# Patient Record
Sex: Female | Born: 1960 | Race: White | Hispanic: No | Marital: Married | Smoking: Current every day smoker
Health system: Southern US, Community
[De-identification: ages and names within clinical notes are randomized; demographics above are authoritative.]

## PROBLEM LIST (undated history)

## (undated) DIAGNOSIS — J45909 Unspecified asthma, uncomplicated: Secondary | ICD-10-CM

## (undated) DIAGNOSIS — M797 Fibromyalgia: Secondary | ICD-10-CM

## (undated) DIAGNOSIS — E119 Type 2 diabetes mellitus without complications: Secondary | ICD-10-CM

## (undated) DIAGNOSIS — K219 Gastro-esophageal reflux disease without esophagitis: Secondary | ICD-10-CM

## (undated) HISTORY — DX: Gastro-esophageal reflux disease without esophagitis: K21.9

## (undated) HISTORY — DX: Type 2 diabetes mellitus without complications: E11.9

## (undated) HISTORY — DX: Unspecified asthma, uncomplicated: J45.909

---

## 2005-01-08 ENCOUNTER — Emergency Department: Payer: Self-pay | Admitting: Unknown Physician Specialty

## 2005-01-09 ENCOUNTER — Emergency Department: Payer: Self-pay | Admitting: Unknown Physician Specialty

## 2005-11-12 ENCOUNTER — Emergency Department: Payer: Self-pay | Admitting: Emergency Medicine

## 2007-06-17 ENCOUNTER — Emergency Department: Payer: Self-pay | Admitting: Emergency Medicine

## 2011-09-16 ENCOUNTER — Emergency Department: Payer: Self-pay | Admitting: Emergency Medicine

## 2012-01-15 ENCOUNTER — Emergency Department: Payer: Self-pay | Admitting: Emergency Medicine

## 2012-04-12 ENCOUNTER — Emergency Department: Payer: Self-pay | Admitting: Emergency Medicine

## 2013-12-27 ENCOUNTER — Ambulatory Visit: Payer: Self-pay

## 2014-03-29 DIAGNOSIS — M1990 Unspecified osteoarthritis, unspecified site: Secondary | ICD-10-CM | POA: Insufficient documentation

## 2014-03-29 DIAGNOSIS — M2550 Pain in unspecified joint: Secondary | ICD-10-CM | POA: Insufficient documentation

## 2015-09-12 ENCOUNTER — Ambulatory Visit: Payer: Self-pay | Attending: Oncology | Admitting: *Deleted

## 2015-09-12 ENCOUNTER — Ambulatory Visit
Admission: RE | Admit: 2015-09-12 | Discharge: 2015-09-12 | Disposition: A | Discharge status: Home / Self Care | Payer: Self-pay | Source: Ambulatory Visit | Attending: Oncology | Admitting: Oncology

## 2015-09-12 ENCOUNTER — Encounter: Payer: Self-pay | Admitting: *Deleted

## 2015-09-12 VITALS — BP 136/84 | HR 80 | Temp 99.0°F | Resp 18 | Ht 64.17 in | Wt 187.6 lb

## 2015-09-12 DIAGNOSIS — Z0000 Encounter for general adult medical examination without abnormal findings: Secondary | ICD-10-CM

## 2015-09-12 NOTE — Progress Notes (Signed)
Subjective:     Patient ID: Stacey Everett, female   DOB: [birth year].   MRN: [id number]  HPI   Review of Systems     Objective:   Physical Exam  Pulmonary/Chest: Right breast exhibits no inverted nipple, no mass, no nipple discharge and no skin change. Left breast exhibits no inverted nipple, no mass, no nipple discharge, no skin change and no tenderness. Breasts are asymmetrical.  Left breast 1-2 cup sizes larger than the right.  Complains of right breast tenderness.  States she drinks 3-4 cups of coffee per day.       Assessment:     55 year old White female presents to [facility] for clinical breast exam and mammogram.  Clinical breast exam unremarkable.  Taught self breast awareness using the teach back method.  Patient has been screened for eligibility.  She does not have any insurance, Medicare or Medicaid.  She also meets financial eligibility.  Hand-out given on the Affordable Care Act.  Patient with financial issues and cannot afford groceries.  States she only gets $16 worth of food stamps.     Plan:     Groceries from the cancer [facility] food bank given to patient by Stacey Georgia, RN.  Screening mammogram ordered.  Will follow-up per [facility]CP protocol.

## 2015-09-21 ENCOUNTER — Encounter: Payer: Self-pay | Admitting: *Deleted

## 2015-09-21 NOTE — Progress Notes (Signed)
Letter mailed from the [facility] to inform patient of her normal mammogram results.  Patient is to follow-up with annual screening in one year.  HSIS to Christy. 

## 2015-10-04 ENCOUNTER — Ambulatory Visit: Payer: Self-pay

## 2015-10-04 DIAGNOSIS — Z720 Tobacco use: Secondary | ICD-10-CM | POA: Insufficient documentation

## 2015-10-04 DIAGNOSIS — E119 Type 2 diabetes mellitus without complications: Secondary | ICD-10-CM | POA: Insufficient documentation

## 2015-10-04 DIAGNOSIS — E039 Hypothyroidism, unspecified: Secondary | ICD-10-CM | POA: Insufficient documentation

## 2015-10-04 DIAGNOSIS — E7800 Pure hypercholesterolemia, unspecified: Secondary | ICD-10-CM | POA: Insufficient documentation

## 2015-10-16 DIAGNOSIS — E7800 Pure hypercholesterolemia, unspecified: Secondary | ICD-10-CM

## 2015-10-16 DIAGNOSIS — E039 Hypothyroidism, unspecified: Secondary | ICD-10-CM

## 2015-10-16 DIAGNOSIS — E119 Type 2 diabetes mellitus without complications: Secondary | ICD-10-CM

## 2015-10-16 DIAGNOSIS — Z720 Tobacco use: Secondary | ICD-10-CM

## 2015-10-22 ENCOUNTER — Ambulatory Visit: Payer: Self-pay

## 2015-10-22 ENCOUNTER — Encounter (INDEPENDENT_AMBULATORY_CARE_PROVIDER_SITE_OTHER): Payer: Self-pay

## 2015-10-25 ENCOUNTER — Ambulatory Visit: Payer: Self-pay | Admitting: Internal Medicine

## 2015-10-25 VITALS — BP 133/82 | HR 81 | Wt 192.0 lb

## 2015-10-25 DIAGNOSIS — E7800 Pure hypercholesterolemia, unspecified: Secondary | ICD-10-CM

## 2015-10-25 DIAGNOSIS — E119 Type 2 diabetes mellitus without complications: Secondary | ICD-10-CM

## 2015-10-25 DIAGNOSIS — E039 Hypothyroidism, unspecified: Secondary | ICD-10-CM

## 2015-10-25 DIAGNOSIS — M2550 Pain in unspecified joint: Secondary | ICD-10-CM

## 2015-10-25 DIAGNOSIS — M5430 Sciatica, unspecified side: Secondary | ICD-10-CM

## 2015-10-25 MED ORDER — GABAPENTIN 400 MG PO CAPS: 400.0000 mg | ORAL_CAPSULE | Freq: Three times a day (TID) | ORAL | Status: DC

## 2015-10-25 MED ORDER — CYCLOBENZAPRINE HCL 5 MG PO TABS: 10.0000 mg | ORAL_TABLET | Freq: Every evening | ORAL | Status: DC | PRN

## 2015-10-25 NOTE — Progress Notes (Signed)
Subjective:    Patient ID: Stacey Everett, female    DOB: 06-08-1961, 55 y.o.   MRN: [id number]  HPI  55YO female presents for follow up.  Not feeling well.  Hurts "all over."  Chronic severe pain in neck, hands. Taking Gabapentin 327m tid. No improvement with this. [facility] Wants to try a new medication called "quill." Also has chronic pain in back which radiates down back of legs. Has some numbness in lower legs bilaterally. Ongoing for 4-5 months. No improvement recently with gabapentin.  Seen by rheumatology in the past, but did not find this helpful.  DM - Does not check BG regularly.   Wt Readings from Last 3 Encounters:  10/25/15 192 lb (87.091 kg)  10/04/15 183 lb (83.008 kg)  09/12/15 187 lb 9.8 oz (85.1 kg)   BP Readings from Last 3 Encounters:  10/25/15 133/82  10/04/15 131/80  09/12/15 136/84    Past Medical History  Diagnosis Date  . GERD (gastroesophageal reflux disease)   . Diabetes mellitus without complication ([place]   . Asthma    Family History  Problem Relation Age of Onset  . Lung cancer Father   . Diabetes Father   . Atrial fibrillation Father   . Heart attack Father   . Stroke Father   . Colon cancer Paternal Grandmother   . Lupus Sister    History reviewed. No pertinent past surgical history. Social History   Social History  . Marital Status: Married    Spouse Name: N/A  . Number of Children: N/A  . Years of Education: N/A   Social History Main Topics  . Smoking status: Current Every Day Smoker -- 0.50 packs/day for 14 years    Types: Cigarettes  . Smokeless tobacco: Never Used  . Alcohol Use: No  . Drug Use: No  . Sexual Activity:    Partners: Male   Other Topics Concern  . None   Social History Narrative    Review of Systems  Constitutional: Negative for fever, chills, appetite change, fatigue and unexpected weight change.  Eyes: Negative for visual disturbance.  Respiratory: Negative for  shortness of breath.   Cardiovascular: Negative for chest pain and leg swelling.  Gastrointestinal: Negative for nausea, vomiting, abdominal pain, diarrhea and constipation.  Musculoskeletal: Positive for myalgias, back pain and arthralgias.  Skin: Negative for color change and rash.  Neurological: Positive for numbness. Negative for weakness.  Hematological: Negative for adenopathy. Does not bruise/bleed easily.  Psychiatric/Behavioral: Negative for sleep disturbance and dysphoric mood. The patient is not nervous/anxious.        Objective:    BP 133/82 mmHg  Pulse 81  Wt 192 lb (87.091 kg)  SpO2 96% Physical Exam  Constitutional: She is oriented to person, place, and time. She appears well-developed and well-nourished. No distress.  HENT:  Head: Normocephalic and atraumatic.  Right Ear: External ear normal.  Left Ear: External ear normal.  Nose: Nose normal.  Mouth/Throat: Oropharynx is clear and moist.  Eyes: Conjunctivae are normal. Pupils are equal, round, and reactive to light. Right eye exhibits no discharge. Left eye exhibits no discharge. No scleral icterus.  Neck: Normal range of motion. Neck supple. No tracheal deviation present. No thyromegaly present.  Cardiovascular: Normal rate, regular rhythm, normal heart sounds and intact distal pulses.  Exam reveals no gallop and no friction rub.   No murmur heard. Pulmonary/Chest: Effort normal and breath sounds normal. No respiratory distress. She has no wheezes. She  has no rales. She exhibits no tenderness.  Musculoskeletal: Normal range of motion. She exhibits no edema or tenderness.  Lymphadenopathy:    She has no cervical adenopathy.  Neurological: She is alert and oriented to person, place, and time. No cranial nerve deficit. She exhibits normal muscle tone. Coordination normal.  Skin: Skin is warm and dry. No rash noted. She is not diaphoretic. No erythema. No pallor.  Psychiatric: She has a normal mood and affect. Her  behavior is normal. Judgment and thought content normal.          Assessment & Plan:   Problem List Items Addressed This Visit      Unprioritized   Arthralgia    Diffuse chronic arthralgia. Reviewed notes from [facility]. Recommended she follow up with them, but she declines. Recommended pain management but she declines. Will repeat some screening labs for RA. However, more likely combination of pain from diabetic neuropathy and OA. Will increase neurontin to 467m tid. Continue Flexeril. Follow up 4 weeks and prn.      Relevant Medications   gabapentin (NEURONTIN) 400 MG capsule   cyclobenzaprine (FLEXERIL) 5 MG tablet   Other Relevant Orders   CBC with Differential/Platelet   Rheumatoid Factor   ANA   Sed Rate (ESR)   C-reactive protein   Diabetes ([place] - Primary    A1c with labs. Continue Metformin.      Relevant Orders   Comprehensive metabolic panel   Lipid panel   Hemoglobin A1c   High cholesterol    Lipids with labs. Continue Crestor.      Hypothyroidism    TSH with labs. Continue Levothyroxine.      Relevant Orders   TSH   Sciatic pain    Chronic sciatic pain. Will set up evaluation with ortho. Question if ESI might be helpful. Increase Gabapentin to 4040mpo tid. Follow up 4 weeks.      Relevant Medications   busPIRone (BUSPAR) 10 MG tablet   venlafaxine (EFFEXOR) 37.5 MG tablet   venlafaxine XR (EFFEXOR-XR) 75 MG 24 hr capsule   gabapentin (NEURONTIN) 400 MG capsule   cyclobenzaprine (FLEXERIL) 5 MG tablet   Other Relevant Orders   Ambulatory referral to Orthopedic Surgery       Return in about 4 weeks (around 11/22/2015) for Recheck of Diabetes.  JeRonette DeterMD Internal Medicine [place]

## 2015-10-25 NOTE — Assessment & Plan Note (Signed)
A1c with labs. Continue Metformin.

## 2015-10-25 NOTE — Assessment & Plan Note (Signed)
TSH with labs. Continue Levothyroxine. 

## 2015-10-25 NOTE — Assessment & Plan Note (Signed)
Diffuse chronic arthralgia. Reviewed notes from [facility]. Recommended she follow up with them, but she declines. Recommended pain management but she declines. Will repeat some screening labs for RA. However, more likely combination of pain from diabetic neuropathy and OA. Will increase neurontin to  tid. Continue Flexeril. Follow up 4 weeks and prn.

## 2015-10-25 NOTE — Patient Instructions (Addendum)
Increase Gabapentin to  three times daily.  Continue Cyclobenzaprine as needed for severe pain.  Labs today.  We will set up evaluation with orthopedics.  Consider follow up with rheumatology.  Follow up in 3 months.

## 2015-10-25 NOTE — Assessment & Plan Note (Signed)
Chronic sciatic pain. Will set up evaluation with ortho. Question if ESI might be helpful. Increase Gabapentin to  po tid. Follow up 4 weeks.

## 2015-10-25 NOTE — Assessment & Plan Note (Signed)
Lipids with labs. Continue Crestor.

## 2015-10-26 LAB — CBC WITH DIFFERENTIAL/PLATELET
Basophils Absolute: 0.1 x10E3/uL (ref 0.0–0.2)
Basos: 1 %
EOS (ABSOLUTE): 0.6 x10E3/uL — ABNORMAL HIGH (ref 0.0–0.4)
Eos: 5 %
Hematocrit: 44 % (ref 34.0–46.6)
Hemoglobin: 15.2 g/dL (ref 11.1–15.9)
Immature Grans (Abs): 0 x10E3/uL (ref 0.0–0.1)
Immature Granulocytes: 0 %
Lymphocytes Absolute: 3.3 x10E3/uL — ABNORMAL HIGH (ref 0.7–3.1)
Lymphs: 27 %
MCH: 32.5 pg (ref 26.6–33.0)
MCHC: 34.5 g/dL (ref 31.5–35.7)
MCV: 94 fL (ref 79–97)
Monocytes Absolute: 0.7 x10E3/uL (ref 0.1–0.9)
Monocytes: 6 %
Neutrophils Absolute: 7.4 x10E3/uL — ABNORMAL HIGH (ref 1.4–7.0)
Neutrophils: 61 %
Platelets: 316 x10E3/uL (ref 150–379)
RBC: 4.68 x10E6/uL (ref 3.77–5.28)
RDW: 14 % (ref 12.3–15.4)
WBC: 12.1 x10E3/uL — ABNORMAL HIGH (ref 3.4–10.8)

## 2015-10-26 LAB — COMPREHENSIVE METABOLIC PANEL
ALT: 16 IU/L (ref 0–32)
AST: 20 IU/L (ref 0–40)
Albumin/Globulin Ratio: 1.9 (ref 1.1–2.5)
Albumin: 4.5 g/dL (ref 3.5–5.5)
Alkaline Phosphatase: 65 IU/L (ref 39–117)
BUN/Creatinine Ratio: 22 (ref 9–23)
BUN: 15 mg/dL (ref 6–24)
Bilirubin Total: 0.2 mg/dL (ref 0.0–1.2)
CALCIUM: 9.2 mg/dL (ref 8.7–10.2)
CO2: 24 mmol/L (ref 18–29)
CREATININE: 0.68 mg/dL (ref 0.57–1.00)
Chloride: 99 mmol/L (ref 96–106)
GFR calc Af Amer: 114 mL/min/{1.73_m2} (ref >59)
GFR, EST NON AFRICAN AMERICAN: 99 mL/min/{1.73_m2} (ref >59)
GLOBULIN, TOTAL: 2.4 g/dL (ref 1.5–4.5)
Glucose: 115 mg/dL — ABNORMAL HIGH (ref 65–99)
Potassium: 4.7 mmol/L (ref 3.5–5.2)
Sodium: 141 mmol/L (ref 134–144)
Total Protein: 6.9 g/dL (ref 6.0–8.5)

## 2015-10-26 LAB — C-REACTIVE PROTEIN: CRP: 6.9 mg/L — AB (ref 0.0–4.9)

## 2015-10-26 LAB — SEDIMENTATION RATE: Sed Rate: 14 mm/hr (ref 0–40)

## 2015-10-26 LAB — ANA: Anti Nuclear Antibody(ANA): NEGATIVE

## 2015-10-26 LAB — HEMOGLOBIN A1C
Est. average glucose Bld gHb Est-mCnc: 131 mg/dL
Hgb A1c MFr Bld: 6.2 % — ABNORMAL HIGH (ref 4.8–5.6)

## 2015-10-26 LAB — RHEUMATOID FACTOR: Rhuematoid fact SerPl-aCnc: <10 IU/mL (ref 0.0–13.9)

## 2015-10-26 LAB — LIPID PANEL
Chol/HDL Ratio: 3.7 ratio (ref 0.0–4.4)
Cholesterol, Total: 165 mg/dL (ref 100–199)
HDL: 45 mg/dL
LDL Calculated: 90 mg/dL (ref 0–99)
Triglycerides: 152 mg/dL — ABNORMAL HIGH (ref 0–149)
VLDL Cholesterol Cal: 30 mg/dL (ref 5–40)

## 2015-10-26 LAB — TSH: TSH: 0.674 u[IU]/mL (ref 0.450–4.500)

## 2015-11-08 DIAGNOSIS — F339 Major depressive disorder, recurrent, unspecified: Secondary | ICD-10-CM

## 2015-11-08 DIAGNOSIS — F329 Major depressive disorder, single episode, unspecified: Secondary | ICD-10-CM | POA: Insufficient documentation

## 2015-11-08 DIAGNOSIS — F4310 Post-traumatic stress disorder, unspecified: Secondary | ICD-10-CM | POA: Insufficient documentation

## 2015-11-09 ENCOUNTER — Other Ambulatory Visit: Payer: Self-pay

## 2015-11-09 DIAGNOSIS — E118 Type 2 diabetes mellitus with unspecified complications: Secondary | ICD-10-CM

## 2015-11-09 LAB — GLUCOSE, POCT (MANUAL RESULT ENTRY): POC GLUCOSE: 98 mg/dL (ref 70–99)

## 2015-11-09 NOTE — Telephone Encounter (Signed)
[facility] stating she has questions regarding blood sugar. Mrs states earlier that morning her blood sugar was 500 mg/dl. Mr states a" pharmacy friend" advised her to take two metofmin when she had already taken one today. So this makes a total of 3.  Advised mrs if this happens again do no change the way she takes her medication unless advised to do so by A physician.  Checked Blood sugar 98 mg/dl.  Advised mrs to keep a regular healthy diet by monitoring Carbohydrates and sugar intake and not to skip meals. Mrs has an appointment on March 29 with dr Candelaria Stagerschaplin, advised mrs it is crucial she keep this appointment. Mrs also stated she only has a few metformin left, I let mrs know I will request refill, it is CRUCIAL she takes medication as directed by Md.

## 2015-11-13 NOTE — Telephone Encounter (Signed)
Patient stopped by clinic to check of refill of metformin, mrs stated she has enough pills for today but will run out tomorrow. Please sign off on this order ASAP,

## 2015-11-14 MED ORDER — METFORMIN HCL 500 MG PO TABS: 500.0000 mg | ORAL_TABLET | Freq: Two times a day (BID) | ORAL | Status: DC

## 2015-11-21 ENCOUNTER — Ambulatory Visit: Payer: Self-pay | Admitting: Internal Medicine

## 2015-11-21 ENCOUNTER — Encounter: Payer: Self-pay | Admitting: Internal Medicine

## 2015-11-21 VITALS — BP 135/83 | HR 93 | Temp 98.5°F | Wt 195.0 lb

## 2015-11-21 DIAGNOSIS — J449 Chronic obstructive pulmonary disease, unspecified: Secondary | ICD-10-CM

## 2015-11-21 DIAGNOSIS — M545 Low back pain, unspecified: Secondary | ICD-10-CM | POA: Insufficient documentation

## 2015-11-21 DIAGNOSIS — E119 Type 2 diabetes mellitus without complications: Secondary | ICD-10-CM

## 2015-11-21 DIAGNOSIS — E7800 Pure hypercholesterolemia, unspecified: Secondary | ICD-10-CM

## 2015-11-21 LAB — POCT GLUCOSE (DEVICE FOR HOME USE): GLUCOSE FASTING, POC: 181 mg/dL — AB (ref 70–99)

## 2015-11-21 MED ORDER — ALBUTEROL SULFATE HFA 108 (90 BASE) MCG/ACT IN AERS: 2.0000 | INHALATION_SPRAY | Freq: Four times a day (QID) | RESPIRATORY_TRACT | Status: DC | PRN

## 2015-11-21 MED ORDER — FLUTICASONE PROPIONATE 50 MCG/ACT NA SUSP: 2.0000 | Freq: Every day | NASAL | Status: DC

## 2015-11-21 NOTE — Assessment & Plan Note (Signed)
Not a kidney function problem. May be related to fibromyalgia.

## 2015-11-21 NOTE — Progress Notes (Signed)
   Subjective:    Patient ID: Stacey Everett, female    DOB: Dec 21, 1960, 55 y.o.   MRN: [id number]  HPI  Patient Active Problem List   Diagnosis Date Noted  . Post traumatic stress disorder (PTSD) 11/08/2015  . Major depression ([place]) 11/08/2015  . Arthralgia 10/25/2015  . Sciatic pain 10/25/2015  . Diabetes ([place]) 10/04/2015  . Tobacco abuse 10/04/2015  . Hypothyroidism 10/04/2015  . High cholesterol 10/04/2015   Pt experiences a lot of pain - neuropathy, osteoarthritis, diabetic nerve pain, fibromyalgia. Experiences numbness in the hands. Pt has serious back pain. Pt uses heating pads regularly. Pt experiences pain in the knees, bone pain in the feet, neck pain and stiffness. Pt claims that heating pad doesn't work.   Pt has GERD but just uses OTC meds.   Pt wants to renew medications and increase Gabapentin. Pt is taking 600 mg of Gabapentin 3 times a day (was prescribed by another physician). From 5 at night to 5 in the morning, pt experiences pain.   Pt goes to [facility] for depression, anxiety, PTSD.  Pt has COPD. Pt wheezes a lot.   Pt saw a rheumatologist at [facility]. Need records from [facility] from Rheumatology clinic.   Pt wants to go see a specialist about her back pain.  Review of Systems  Respiratory: Positive for wheezing.   Endocrine: Positive for polyuria.  Musculoskeletal: Positive for joint swelling and arthralgias.  Neurological: Positive for numbness.  Psychiatric/Behavioral: Positive for behavioral problems. The patient is nervous/anxious.        Objective:   Physical Exam  Constitutional: She is oriented to person, place, and time.  Cardiovascular: Normal rate, regular rhythm and normal heart sounds.   Pulmonary/Chest: She has wheezes.  Neurological: She is alert and oriented to person, place, and time.    BP 135/83 mmHg  Pulse 93  Temp(Src) 98.5 F (36.9 C)  Wt 195 lb (88.451 kg)  Blood glucose 11/21/15: 181  Lipids, BP, and sugars look good from  past labs.       Assessment & Plan:   Diabetes ([place]) Sugars look stable.   High cholesterol Lipids look stable from current labs.  Low back pain Not a kidney function problem. May be related to fibromyalgia.    Md f/u: 3 months Labs: lipid, met c, cbc, vitamin d, a1c  Need a clean-catch urine sample today.  [facility].  Pt needs to go to [facility] to figure out which of her old medications they can fill (ex: Asmanex)

## 2015-11-21 NOTE — Addendum Note (Signed)
Addended by: Candelaria StagersHAPLIN, Dameian Crisman C on: 11/21/2015 10:37 AM   Modules accepted: SmartSet

## 2015-11-21 NOTE — Assessment & Plan Note (Signed)
Sugars look stable.

## 2015-11-21 NOTE — Assessment & Plan Note (Signed)
Lipids look stable from current labs.

## 2015-11-22 LAB — URINALYSIS
Bilirubin, UA: NEGATIVE
GLUCOSE, UA: NEGATIVE
KETONES UA: NEGATIVE
LEUKOCYTES UA: NEGATIVE
Nitrite, UA: NEGATIVE
PROTEIN UA: NEGATIVE
RBC, UA: NEGATIVE
Specific Gravity, UA: 1.012 (ref 1.005–1.030)
UUROB: 0.2 mg/dL (ref 0.2–1.0)
pH, UA: 6 (ref 5.0–7.5)

## 2015-12-04 ENCOUNTER — Ambulatory Visit: Payer: Self-pay | Admitting: Family Medicine

## 2015-12-04 VITALS — BP 138/87 | HR 94 | Resp 16 | Wt 195.0 lb

## 2015-12-04 DIAGNOSIS — J449 Chronic obstructive pulmonary disease, unspecified: Secondary | ICD-10-CM

## 2015-12-04 DIAGNOSIS — E1342 Other specified diabetes mellitus with diabetic polyneuropathy: Secondary | ICD-10-CM

## 2015-12-04 DIAGNOSIS — E119 Type 2 diabetes mellitus without complications: Secondary | ICD-10-CM

## 2015-12-04 DIAGNOSIS — M2550 Pain in unspecified joint: Secondary | ICD-10-CM

## 2015-12-04 LAB — GLUCOSE, POCT (MANUAL RESULT ENTRY): POC GLUCOSE: 212 mg/dL — AB (ref 70–99)

## 2015-12-04 MED ORDER — GABAPENTIN 400 MG PO CAPS: 400.0000 mg | ORAL_CAPSULE | Freq: Four times a day (QID) | ORAL | Status: DC

## 2015-12-04 MED ORDER — FLUTICASONE-SALMETEROL 250-50 MCG/DOSE IN AEPB: 1.0000 | INHALATION_SPRAY | Freq: Two times a day (BID) | RESPIRATORY_TRACT | Status: DC

## 2015-12-04 NOTE — Progress Notes (Signed)
Patient: Stacey Everett Female    DOB: 1961/06/10   55 y.o.   MRN: [id number] Visit Date: 12/04/2015  Today's Provider: [facility]   Chief Complaint  Patient presents with  . Diabetes  . Hemorrhoids  . Leg Pain   Subjective:    HPIea 55 yo female here for recheck on her diabetes.  Concerned that it runs low and high.  Has not been to see the endocrinologist.  Feels really bad when it runs low.  Dropped at 4 today. Did eat a bowl of cereal and a sandwich.     Also tried to go to [facility] for rheumatology. Did not make appointment secondary to her ride. Rescheduled.  Has a counselor that sees her from [facility]. She is going to take her next time.  Can not drive that far secondary to arm numbness.  Having trouble walking.    Also increased urine frequency.  Also, needs her gabapentin refilled. Not holding neuropathy pain.      Allergies  Allergen Reactions  . Nitrofurantoin Shortness Of Breath    Other  . Azithromycin   . Celebrex [Celecoxib]   . Paroxetine   . Paxil [Paroxetine Hcl]   . Salmeterol   . Sulfur     Other   Previous Medications   ALBUTEROL (PROVENTIL HFA;VENTOLIN HFA) 108 (90 BASE) MCG/ACT INHALER    Inhale 2 puffs into the lungs every 6 (six) hours as needed for wheezing or shortness of breath (6 puffs every 6 hours as needed.).   ATORVASTATIN (LIPITOR) 20 MG TABLET    Take 20 mg by mouth daily.   BUSPIRONE (BUSPAR) 10 MG TABLET    Take 10 mg by mouth 2 (two) times daily.   BUSPIRONE (BUSPAR) 5 MG TABLET    Take 5 mg by mouth 2 (two) times daily. Reported on 12/04/2015   CYCLOBENZAPRINE (FLEXERIL) 5 MG TABLET    Take 2 tablets (10 mg total) by mouth at bedtime as needed for muscle spasms. 1 by mouth at bedtime as needed for back pain.   DICLOFENAC SODIUM (VOLTAREN) 1 % GEL    Apply 2 g topically 4 (four) times daily. Apply 2 grams to affected joint up to 4 times daily for pain.   DULOXETINE (CYMBALTA) 20 MG CAPSULE    Take 20 mg by mouth daily.  Reported on 12/04/2015   FLUTICASONE (FLONASE) 50 MCG/ACT NASAL SPRAY    Place 2 sprays into both nostrils daily. 1 spray in each nostril everyday.   GABAPENTIN (NEURONTIN) 400 MG CAPSULE    Take 1 capsule (400 mg total) by mouth 3 (three) times daily.   IPRATROPIUM-ALBUTEROL (DUONEB) 0.5-2.5 (3) MG/3ML SOLN    Take 3 mLs by nebulization every 6 (six) hours as needed (1 neb inhaled every 6 hours as needed SOB.).   LEVOTHYROXINE SODIUM 100 MCG CAPS    Take 1 capsule by mouth daily before breakfast. 1 by mouth once a day for hypothyroidism.   METFORMIN (GLUCOPHAGE) 500 MG TABLET    Take 1 tablet (500 mg total) by mouth 2 (two) times daily with a meal.   MOMETASONE (ASMANEX 120 METERED DOSES) 220 MCG/INH INHALER    Inhale 2 puffs into the lungs 2 (two) times daily. 2 puffs 2 times a day.   MOMETASONE (NASONEX) 50 MCG/ACT NASAL SPRAY    Place 2 sprays into the nose daily. Reported on 12/04/2015   OXYBUTYNIN (DITROPAN) 5 MG TABLET    Take 5  mg by mouth daily. Reported on 12/04/2015   ROSUVASTATIN (CRESTOR) 10 MG TABLET    Take 10 mg by mouth daily. Reported on 12/04/2015   VENLAFAXINE (EFFEXOR) 37.5 MG TABLET    Take 37.5 mg by mouth daily. Reported on 12/04/2015   VENLAFAXINE XR (EFFEXOR-XR) 75 MG 24 HR CAPSULE    Take 75 mg by mouth daily with breakfast.   VITAMIN D, CHOLECALCIFEROL, 1000 UNITS TABS    Take 1 tablet by mouth daily. 1 by mouth once daily for vitamin D deficiency.    Review of Systems  Genitourinary: Positive for frequency.  Musculoskeletal: Positive for back pain.       Leg pain.  Worse with walking.    Neurological: Positive for numbness.    Social History  Substance Use Topics  . Smoking status: Current Every Day Smoker -- 0.50 packs/day for 14 years    Types: Cigarettes  . Smokeless tobacco: Never Used  . Alcohol Use: No   Objective:   BP 138/87 mmHg  Pulse 94  Resp 16  Wt 195 lb (88.451 kg)  Physical Exam  Constitutional: She is oriented to person, place, and time. She  appears well-developed and well-nourished.  Musculoskeletal:  Slow to get up. Antalgic gait.   Neurological: She is alert and oriented to person, place, and time.      Assessment & Plan:     1. Type 2 diabetes mellitus without complication, without long-term current use of insulin (HCC) Blood sugar too high today.  Was low earlier today. Briefly discussed appropriate diet. Continue metformin. Follow up with endocrinology clinic to address further.   - POCT Glucose (CBG) Results for orders placed or performed in visit on 12/04/15  POCT Glucose (CBG)  Result Value Ref Range   POC Glucose 212 (A) 70 - 99 mg/dl    2. Arthralgia Condition is worsening. Will increase medication for better control.   - gabapentin (NEURONTIN) 400 MG capsule; Take 1 capsule (400 mg total) by mouth 4 (four) times daily.  Dispense: 120 capsule; Refill: 1  3. Secondary diabetes with peripheral neuropathy (HCC) Medication as above.   4. Chronic obstructive pulmonary disease, unspecified COPD type (HCC) Refilled medication today.   - Fluticasone-Salmeterol (ADVAIR DISKUS) 250-50 MCG/DOSE AEPB; Inhale 1 puff into the lungs 2 (two) times daily.  Dispense: 60 each; Refill: 3     Lorie PhenixNancy Clotile Whittington, MD  [facility]  [facility]

## 2015-12-19 ENCOUNTER — Telehealth: Payer: Self-pay

## 2015-12-19 NOTE — Telephone Encounter (Signed)
[facility] stating that the patient was on Asmanex 220 mcg 2 puffs BID at [facility] for [facility]. [facility]. [facility] Asmanex 200 mcg HFA through [facility]. This will be available Monday. Note: Patient is not currently on a long-acting bronchodilator Recommendations: 1. Asmanex HFA 200 mcg 2 puffs BID 1,3 refills  ____Accept   ____Accept as modified  ______Decline  2. Consider Adding: Stiolto (oldaterol and tiotropium)- 1 inhalation daily 1, 3 refills  (this includes a long acting muscarinic antagonist)  _____Accept  _____Accept as modified  _____Decline  3. Please let [facility] know if she should remain on the Asmanex and/or the Stiolto so we can order through [facility].

## 2015-12-25 NOTE — Telephone Encounter (Signed)
[facility] stating that the patient was on Asmanex 220 mcg 2 puffs BID at [facility] for [facility]. She [facility]. [facility] Asmanex 200 mcg HFA through [facility]. This will be available Monday. Note: Patient is not currently on a long-acting bronchodilator  Recommendations:  1. Asmanex HFA 200 mcg 2 puffs BID 1,3 refills __x__Accept ____Accept as modified ______Decline  2. Consider Adding:  Stiolto (oldaterol and tiotropium)- 1 inhalation daily 1, 3 refills (this includes a long acting muscarinic antagonist) __x___Accept _____Accept as modified _____Decline  3. Please let [facility] know if she should remain on the Asmanex and/or the Stiolto so we can order through [facility].

## 2015-12-31 ENCOUNTER — Other Ambulatory Visit: Payer: Self-pay | Admitting: Internal Medicine

## 2016-01-01 ENCOUNTER — Other Ambulatory Visit: Payer: Self-pay

## 2016-01-01 DIAGNOSIS — E7800 Pure hypercholesterolemia, unspecified: Secondary | ICD-10-CM

## 2016-01-01 DIAGNOSIS — E119 Type 2 diabetes mellitus without complications: Secondary | ICD-10-CM

## 2016-01-02 LAB — COMPREHENSIVE METABOLIC PANEL
ALBUMIN: 4.3 g/dL (ref 3.5–5.5)
ALK PHOS: 85 IU/L (ref 39–117)
ALT: 20 IU/L (ref 0–32)
AST: 20 IU/L (ref 0–40)
Albumin/Globulin Ratio: 1.7 (ref 1.2–2.2)
BUN / CREAT RATIO: 17 (ref 9–23)
BUN: 15 mg/dL (ref 6–24)
Bilirubin Total: 0.3 mg/dL (ref 0.0–1.2)
CHLORIDE: 95 mmol/L — AB (ref 96–106)
CO2: 25 mmol/L (ref 18–29)
CREATININE: 0.86 mg/dL (ref 0.57–1.00)
Calcium: 9.5 mg/dL (ref 8.7–10.2)
GFR calc Af Amer: 88 mL/min/{1.73_m2} (ref >59)
GFR calc non Af Amer: 76 mL/min/{1.73_m2} (ref >59)
GLUCOSE: 172 mg/dL — AB (ref 65–99)
Globulin, Total: 2.5 g/dL (ref 1.5–4.5)
Potassium: 4.2 mmol/L (ref 3.5–5.2)
Sodium: 138 mmol/L (ref 134–144)
Total Protein: 6.8 g/dL (ref 6.0–8.5)

## 2016-01-02 LAB — CBC WITH DIFFERENTIAL/PLATELET
Basophils Absolute: 0.1 10*3/uL (ref 0.0–0.2)
Basos: 1 %
EOS (ABSOLUTE): 0.3 10*3/uL (ref 0.0–0.4)
EOS: 3 %
HEMOGLOBIN: 15.6 g/dL (ref 11.1–15.9)
Hematocrit: 45.9 % (ref 34.0–46.6)
Immature Grans (Abs): 0 10*3/uL (ref 0.0–0.1)
Immature Granulocytes: 0 %
LYMPHS ABS: 2.7 10*3/uL (ref 0.7–3.1)
Lymphs: 25 %
MCH: 32 pg (ref 26.6–33.0)
MCHC: 34 g/dL (ref 31.5–35.7)
MCV: 94 fL (ref 79–97)
MONOS ABS: 0.6 10*3/uL (ref 0.1–0.9)
Monocytes: 6 %
NEUTROS ABS: 7.1 10*3/uL — AB (ref 1.4–7.0)
Neutrophils: 65 %
Platelets: 315 10*3/uL (ref 150–379)
RBC: 4.87 x10E6/uL (ref 3.77–5.28)
RDW: 13.9 % (ref 12.3–15.4)
WBC: 10.8 10*3/uL (ref 3.4–10.8)

## 2016-01-02 LAB — VITAMIN D 25 HYDROXY (VIT D DEFICIENCY, FRACTURES): Vit D, 25-Hydroxy: 36.2 ng/mL (ref 30.0–100.0)

## 2016-01-02 LAB — HEMOGLOBIN A1C
Est. average glucose Bld gHb Est-mCnc: 148 mg/dL
Hgb A1c MFr Bld: 6.8 % — ABNORMAL HIGH (ref 4.8–5.6)

## 2016-01-02 LAB — LIPID PANEL
Chol/HDL Ratio: 3.3 ratio units (ref 0.0–4.4)
Cholesterol, Total: 156 mg/dL (ref 100–199)
HDL: 47 mg/dL (ref >39)
LDL Calculated: 75 mg/dL (ref 0–99)
Triglycerides: 169 mg/dL — ABNORMAL HIGH (ref 0–149)
VLDL CHOLESTEROL CAL: 34 mg/dL (ref 5–40)

## 2016-01-08 ENCOUNTER — Ambulatory Visit: Payer: Self-pay

## 2016-01-10 ENCOUNTER — Ambulatory Visit: Payer: Self-pay | Admitting: Nurse Practitioner

## 2016-01-10 VITALS — BP 114/72 | HR 92 | Resp 16 | Wt 199.0 lb

## 2016-01-10 DIAGNOSIS — E1342 Other specified diabetes mellitus with diabetic polyneuropathy: Secondary | ICD-10-CM

## 2016-01-10 DIAGNOSIS — E039 Hypothyroidism, unspecified: Secondary | ICD-10-CM

## 2016-01-10 DIAGNOSIS — Z720 Tobacco use: Secondary | ICD-10-CM

## 2016-01-10 DIAGNOSIS — E7800 Pure hypercholesterolemia, unspecified: Secondary | ICD-10-CM

## 2016-01-10 MED ORDER — MOMETASONE FUROATE 220 MCG/INH IN AEPB: 2.0000 | INHALATION_SPRAY | Freq: Two times a day (BID) | RESPIRATORY_TRACT | Status: DC

## 2016-01-10 NOTE — Progress Notes (Signed)
Pt with diabetes, takes 2 500 mg metformin each evening.    Has multiple medical problems, with, osteoarthritis, followed by rheumatology at [facility], has peripheral neuropathy. Helped by gabapentin.    Positive for carpal tunnel.    Continues to smoke.  < 1 ppd.    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    Is concerned that she is getting too much thyroid medication.   History of PTSD.  Is followed by mental health.  Ongoing c/o joint pain, neuropathy, is followed by rheumatology   Exam:  Alert, verbally appropriate  No palpable adenopathy, no thyromegaly, no carotid bruits.    BBrS decreased, no rales, rhonchi, or wheezes.    AP with regular rate and rhythm, posterior tibial pulses palpable bilaterally.    Anxious affect.    Albin FellingCarla was seen today for diabetes, hyperlipidemia and hypothyroidism.  Diagnoses and all orders for this visit:  Hypothyroidism, unspecified hypothyroidism type -     TSH; Future  Secondary diabetes with peripheral neuropathy (HCC)  High cholesterol  Tobacco abuse disorder  Other orders -     mometasone (ASMANEX 60 METERED DOSES) 220 MCG/INH inhaler; Inhale 2 puffs into the lungs 2 (two) times daily.     Hypothyroidism, will continue current dose, and will have tsh drawn before her endo appointment on 06/20  Lipid profile is within acceptable limits, will continue current medication, and will defer any changes, until pt is seen by endocrinology  Diabetes, type II, takes 1000 mg of metformin each evening, a1c at 6.8, will not make any changes, will be seen by endocrinology on 06/20 at 6 pm.  Tobacco smoking < one ppd.    Peripheral neuroapthy will continue gabapentin.

## 2016-01-24 ENCOUNTER — Other Ambulatory Visit: Payer: Self-pay | Admitting: Nurse Practitioner

## 2016-01-24 MED ORDER — IPRATROPIUM-ALBUTEROL 0.5-2.5 (3) MG/3ML IN SOLN: 3.0000 mL | Freq: Four times a day (QID) | RESPIRATORY_TRACT | Status: DC | PRN

## 2016-01-31 ENCOUNTER — Ambulatory Visit: Payer: Self-pay | Admitting: Urology

## 2016-01-31 ENCOUNTER — Other Ambulatory Visit: Payer: Self-pay

## 2016-01-31 DIAGNOSIS — E039 Hypothyroidism, unspecified: Secondary | ICD-10-CM

## 2016-01-31 DIAGNOSIS — K047 Periapical abscess without sinus: Secondary | ICD-10-CM

## 2016-01-31 MED ORDER — AMOXICILLIN-POT CLAVULANATE 875-125 MG PO TABS: 1.0000 | ORAL_TABLET | Freq: Two times a day (BID) | ORAL | Status: DC

## 2016-01-31 NOTE — Progress Notes (Signed)
  Patient: Stacey Everett Female    DOB: 06/30/1961   55 y.o.   MRN: [id number] Visit Date: 01/31/2016  Today's Provider: [facility]   No chief complaint on file.  Subjective:    HPI Tooth abscess    Allergies  Allergen Reactions  . Nitrofurantoin Shortness Of Breath    Other  . Azithromycin   . Celebrex [Celecoxib]   . Paroxetine   . Paxil [Paroxetine Hcl]   . Salmeterol   . Sulfur     Other   Previous Medications   ALBUTEROL (PROVENTIL HFA;VENTOLIN HFA) 108 (90 BASE) MCG/ACT INHALER    Inhale 2 puffs into the lungs every 6 (six) hours as needed for wheezing or shortness of breath (6 puffs every 6 hours as needed.).   ATORVASTATIN (LIPITOR) 20 MG TABLET    Take 20 mg by mouth daily. Reported on 01/10/2016   BUSPIRONE (BUSPAR) 10 MG TABLET    Take 10 mg by mouth 2 (two) times daily.   CYCLOBENZAPRINE (FLEXERIL) 5 MG TABLET    Take 2 tablets (10 mg total) by mouth at bedtime as needed for muscle spasms. 1 by mouth at bedtime as needed for back pain.   DICLOFENAC SODIUM (VOLTAREN) 1 % GEL    Apply 2 g topically 4 (four) times daily. Apply 2 grams to affected joint up to 4 times daily for pain.   FLUTICASONE (FLONASE) 50 MCG/ACT NASAL SPRAY    Place 2 sprays into both nostrils daily. 1 spray in each nostril everyday.   GABAPENTIN (NEURONTIN) 400 MG CAPSULE    Take 1 capsule (400 mg total) by mouth 4 (four) times daily.   IPRATROPIUM-ALBUTEROL (DUONEB) 0.5-2.5 (3) MG/3ML SOLN    Take 3 mLs by nebulization every 6 (six) hours as needed (1 neb inhaled every 6 hours as needed SOB.).   LEVOTHYROXINE SODIUM 100 MCG CAPS    Take 1 capsule by mouth daily before breakfast. 1 by mouth once a day for hypothyroidism.   METFORMIN (GLUCOPHAGE) 500 MG TABLET    Take 1 tablet (500 mg total) by mouth 2 (two) times daily with a meal.   MOMETASONE (ASMANEX 60 METERED DOSES) 220 MCG/INH INHALER    Inhale 2 puffs into the lungs 2 (two) times daily.   MOMETASONE (NASONEX) 50 MCG/ACT NASAL  SPRAY    Place 2 sprays into the nose daily. Reported on 01/10/2016   VENLAFAXINE XR (EFFEXOR-XR) 75 MG 24 HR CAPSULE    Take 75 mg by mouth daily with breakfast.   VITAMIN D, CHOLECALCIFEROL, 1000 UNITS TABS    Take 1 tablet by mouth daily. 1 by mouth once daily for vitamin D deficiency.    Review of Systems  Social History  Substance Use Topics  . Smoking status: Current Every Day Smoker -- 0.50 packs/day for 14 years    Types: Cigarettes  . Smokeless tobacco: Never Used  . Alcohol Use: No   Objective:   There were no vitals taken for this visit.  Physical Exam  Abscess in the right lower molar    Assessment & Plan:     1. Abscess in the right lower molar  -given Augmentin 875/125, one tablet twice daily for ten days  -patient is advised to get to the dentist      [facility]   [facility] [facility]

## 2016-02-01 LAB — TSH: TSH: 1.84 u[IU]/mL (ref 0.450–4.500)

## 2016-02-07 ENCOUNTER — Ambulatory Visit: Payer: Self-pay | Admitting: Family Medicine

## 2016-02-07 VITALS — BP 148/84 | HR 100 | Temp 99.0°F | Resp 16 | Wt 200.0 lb

## 2016-02-07 DIAGNOSIS — K047 Periapical abscess without sinus: Secondary | ICD-10-CM

## 2016-02-07 DIAGNOSIS — J449 Chronic obstructive pulmonary disease, unspecified: Secondary | ICD-10-CM

## 2016-02-07 DIAGNOSIS — E1342 Other specified diabetes mellitus with diabetic polyneuropathy: Secondary | ICD-10-CM

## 2016-02-07 DIAGNOSIS — E039 Hypothyroidism, unspecified: Secondary | ICD-10-CM

## 2016-02-07 DIAGNOSIS — R5382 Chronic fatigue, unspecified: Secondary | ICD-10-CM

## 2016-02-07 DIAGNOSIS — M2550 Pain in unspecified joint: Secondary | ICD-10-CM

## 2016-02-07 LAB — GLUCOSE, POCT (MANUAL RESULT ENTRY): POC Glucose: 130 mg/dl — AB (ref 70–99)

## 2016-02-07 MED ORDER — DICLOFENAC SODIUM 1 % TD GEL: 2.0000 g | Freq: Four times a day (QID) | TRANSDERMAL | Status: DC

## 2016-02-07 MED ORDER — AMOXICILLIN-POT CLAVULANATE 875-125 MG PO TABS: 1.0000 | ORAL_TABLET | Freq: Two times a day (BID) | ORAL | Status: DC

## 2016-02-07 MED ORDER — ALBUTEROL SULFATE HFA 108 (90 BASE) MCG/ACT IN AERS: 2.0000 | INHALATION_SPRAY | Freq: Four times a day (QID) | RESPIRATORY_TRACT | Status: DC | PRN

## 2016-02-07 NOTE — Progress Notes (Signed)
BP 148/84 mmHg  Pulse 100  Temp(Src) 99 F (37.2 C)  Resp 16  Wt 200 lb (90.719 kg)  SpO2 98%   Subjective:    Patient ID: Stacey Everett, female    DOB: 1961/02/24, 55 y.o.   MRN: [id number]  HPI: Stacey Everett is a 55 y.o. female  Chief Complaint  Patient presents with  . Follow-up  . Peripheral Neuropathy  . Dental Pain    on antibiotics- following up with dentist  . Medication Refill   DENTAL PAIN- hasn't called her dentist yet due to cost.  Duration: last week Involved teeth: R lower molor, same tooth is starting to hurt again.  Dentist evaluation: Pending Mechanism of injury:  unknown  Onset: sudden Severity: severe Quality: aching and sharp Frequency: constant Radiation: none Aggravating factors: chewing Alleviating factors: antibiotice Status: better Treatments attempted: antibiotics Relief with NSAIDs?: No NSAIDs Taken Fevers: yes Swelling: yes Redness: yes Paresthesias / decreased sensation: no Sinus pressure: no  NEUROPATHY- follows with rheumatology- on gabapentin. Going to see neurologist on Monday to help with her neuropathy Neuropathy status: uncontrolled  Satisfied with current treatment?: no Medication side effects: no Medication compliance:  excellent compliance Location: legs and arms Pain: yes Severity: severe  Quality:  Numb and pins and needles Frequency: constant Bilateral: yes Symmetric: yes Numbness: yes Decreased sensation: yes Weakness: no Context: worse  DIABETES- A1c last visit was 6.8, currently on 1078m of metformin daily. Going to see endocrinology early next week. (6/20)- she states that she ate like she was supposed to, and her sugars went low and she felt like she was she was going to pass out.  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   Relevant past medical, surgical, family and social history reviewed and updated as indicated. Interim medical history since our last visit reviewed. Allergies and medications reviewed and updated.  Review of Systems  Constitutional: Negative.   Respiratory: Negative.   Cardiovascular: Negative.   Musculoskeletal: Positive for myalgias, arthralgias and gait problem. Negative for back pain, joint swelling, neck pain and neck stiffness.  Neurological: Positive for numbness. Negative for dizziness, tremors, seizures, syncope, facial asymmetry, speech difficulty, weakness, light-headedness and headaches.  Psychiatric/Behavioral: Negative.     Per HPI unless specifically indicated above      Objective:    BP 148/84 mmHg  Pulse 100  Temp(Src) 99 F (37.2 C)  Resp 16  Wt 200 lb (90.719 kg)  SpO2 98%  Wt Readings from Last 3 Encounters:  02/07/16 200 lb (90.719 kg)  01/31/16 198 lb 8 oz (90.039 kg)  01/10/16 199 lb (90.266 kg)    Physical Exam  Constitutional: She is oriented to person, place, and time. She appears well-developed and well-nourished. No distress.  HENT:  Head: Normocephalic and atraumatic.  Right Ear: Hearing normal.  Left Ear: Hearing normal.  Nose: Nose normal.  R lower molar with swelling and tenderness, multiple decaying teeth  Eyes: Conjunctivae and lids are normal. Right eye exhibits no discharge. Left eye exhibits no discharge. No scleral icterus.  Cardiovascular: Normal rate, regular rhythm, normal heart sounds  and intact distal pulses.  Exam reveals no gallop and no friction rub.   No murmur heard. Pulmonary/Chest: Effort normal and breath sounds normal. No respiratory distress. She has no wheezes. She has no rales. She exhibits no tenderness.  Musculoskeletal: Normal range of motion.  Neurological: She is alert and oriented to person, place, and time.  Skin: Skin is warm, dry and intact. No rash noted. No erythema. No pallor.  Psychiatric: She has a normal mood and affect. Her speech is normal and behavior is normal. Judgment and thought content normal. Cognition and memory are normal.  Nursing note and vitals reviewed.   Results for orders placed or performed in visit on 02/07/16  POCT Glucose (CBG)  Result Value Ref Range   POC Glucose 130 (A) 70 - 99 mg/dl      Assessment & Plan:   Problem List Items Addressed This Visit      Respiratory   COPD (chronic obstructive pulmonary disease) (HCC)    Refill of her albuterol given today. Continue current regimen. Continue to monitor.       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 Await that appointment. To see neurology on Monday. Continue gabapentin for now. Await appointment.       Relevant Orders   POCT Glucose (CBG) (Completed)   Hypothyroidism    Under good control on last check. Continue current regimen. To see endocrinology next week.         Other   Arthralgia    Discussed that since I just met her, I cannot write her a note of disability. She will discuss this with her neurologist who has seen her for several years.        Other Visit Diagnoses    Tooth abscess        Hasn't gotten into dentist. Refill of augmentin given. Call dentist this week.     Chronic fatigue        ?Narcolepsy as she states that she had this as a child. Likely poor sleep habits.  Patient will discuss with her psychiatrist. ? need for sleep study.        Follow up plan: Return 1-2 months.

## 2016-02-07 NOTE — Patient Instructions (Signed)
Sleep Studies A sleep study (polysomnogram) is a series of tests done while you are sleeping. It can show how well you sleep. This can help your health care provider diagnose a sleep disorder and show how severe your sleep disorder is. A sleep study may lead to treatment that will help you sleep better and prevent other medical problems caused by poor sleep. If you have a sleep disorder, you may also be at risk for:   Sleep-related accidents.  High blood pressure.  Heart disease.  Stroke.  Other medical conditions. Sleep disorders are common. Your health care provider may suspect a sleep disorder if you:  Have loud snoring most nights.  Have brief periods when you stop breathing at night.  Feel sleepy on most days.  Fall asleep suddenly during the day.  Have trouble falling asleep or staying asleep.  Feel like you need to move your legs when trying to fall asleep.  Have dreams that seem very real shortly after falling asleep.  Feel like you cannot move when you first wake up. WHICH TESTS WILL I NEED TO HAVE?  Most sleep studies last all night and include these tests:  Recordings of your brain activity.  Recordings of your eye movements.  Recording of your heart rate and rhythm.  Blood pressure readings.  Readings of the amount of oxygen in your blood.  Measurements of your chest and belly movement as you breathe during sleep. If you have signs of the sleep disorder called sleep apnea during your test, you may get a mask to wear for the second half of the night.   The mask provides continuous positive airway pressure (CPAP). This may improve sleep apnea significantly.  You will then have all tests done again with the mask in place to see if your measurements and recordings change. HOW ARE SLEEP STUDIES DONE? Most sleep studies are done over one full night of sleep.   You will arrive at the study center in the evening and can go home in the morning.  Bring your  pajamas and toothbrush.  Do not have caffeine on the day of your sleep study.  Your health care provider will let you know if you need to stop taking any of your regular medicines before the test. To do the tests included in a polysomnogram, you will have:  Round, sticky patches with sensors attached to recording wires (electrodes) placed on your scalp, face, chest, and limbs.  Wires from all the electrodes and sensors run from your bed to a computer. The wires can be taken off and put back on if you need to get out of bed to go to the bathroom.  A sensor placed over your nose to measure airflow.  A finger clip put on one finger to measure your blood oxygen level.  A belt around your belly and a belt around your chest to measure breathing movements. WHERE ARE SLEEP STUDIES DONE?  Sleep studies are done at sleep centers. A sleep center may be inside a hospital, office, or clinic.  The room where you have the study may look like a hospital room or a hotel room. The health care providers doing the study may come in and out of the room during the study. Most of the time, they will be in another room monitoring your test.  HOW IS INFORMATION FROM SLEEP STUDIES HELPFUL? A polysomnogram can be used along with your medical history and a physical exam to diagnose conditions, such as:  Sleep   apnea.  Restless legs syndrome.  Sleep-related seizure disorders.  Sleep-related movement disorders. A medical doctor who specializes in sleep will evaluate your sleep study. The specialist will share the results with your primary health care provider. Treatments based on your sleep study may include:  Improving your sleep habits (sleep hygiene).  Wearing a CPAP mask.  Wearing an oral device at night to improve breathing and reduce snoring.  Taking medicine for:  Restless legs syndrome.  Sleep-related seizure disorder.  Sleep-related movement disorder.   This information is not intended to  replace advice given to you by your health care provider. Make sure you discuss any questions you have with your health care provider.   Document Released: 02/15/2003 Document Revised: 09/01/2014 Document Reviewed: 10/17/2013 Elsevier Interactive Patient Education 2016 Elsevier Inc[employer] Hypersomnia Hypersomnia is when you feel extremely tired during the day even though you're getting plenty of sleep at night. You may need to take naps during the day, and you may also be extremely difficult to wake up when you are sleeping.  CAUSES  The cause of your hypersomnia may not be known. Hypersomnia may be caused by:   Medicines.  Sleep disorders, such as narcolepsy.  Trauma or injury to your head or nervous system.  Using drugs or alcohol.  Tumors.  Medical conditions, such as depression or hypothyroidism.  Genetics. SIGNS AND SYMPTOMS  The main symptoms of hypersomnia include:   Feeling extremely tired throughout the day.  Being very difficult to wake up.  Sleeping for longer and longer periods.  Taking naps throughout the day. Other symptoms may include:   Feeling:  Restless.  Annoyed.  Anxious.  Low energy.  Having difficulty:  Remembering.  Speaking.  Thinking.  Losing your appetite.  Experiencing hallucinations. DIAGNOSIS  Hypersomnia may be diagnosed by:  Medical history and physical exam. This will include a sleep history.  Completing sleep logs.  Tests may also be done, such as:  Polysomnography.  Multiple sleep latency test (MSLT). TREATMENT  There is no cure for hypersomnia, but treatment can be very effective in helping manage the condition. Treatment may include:  Lifestyle and sleeping strategies to help cope with the condition.  Stimulant medicines.  Treating any underlying causes of hypersomnia. HOME CARE INSTRUCTIONS  Take medicines only as directed by your health care provider.  Schedule short naps for when you feel sleepiest during  the day. Tell your employer or teachers that you have hypersomnia. You may be able to adjust your schedule to include time for naps.  Avoid drinking alcohol or caffeinated beverages.  Do not eat a heavy meal before bedtime. Eat at about the same times every day.  Do not drive or operate heavy machinery if you are sleepy.  Do not swim or go out on the water without a life jacket.  If possible, adjust your schedule so that you do not have to work or be active at night.  Keep all follow-up visits as directed by your health care provider. This is important. SEEK MEDICAL CARE IF:   You have new symptoms.  Your symptoms get worse. SEEK IMMEDIATE MEDICAL CARE IF:  You have serious thoughts of hurting yourself or someone else.   This information is not intended to replace advice given to you by your health care provider. Make sure you discuss any questions you have with your health care provider.   Document Released: 08/01/2002 Document Revised: 09/01/2014 Document Reviewed: 03/16/2014 Elsevier Interactive Patient Education 2016 Elsevier Inc.  

## 2016-02-07 NOTE — Assessment & Plan Note (Signed)
Discussed that since I just met her, I cannot write her a note of disability. She will discuss this with her neurologist who has seen her for several years.

## 2016-02-07 NOTE — Assessment & Plan Note (Signed)
Refill of her albuterol given today. Continue current regimen. Continue to monitor.

## 2016-02-07 NOTE — Assessment & Plan Note (Addendum)
To see endocrinology next week. Await that appointment. To see neurology on Monday. Continue gabapentin for now. Await appointment.

## 2016-02-07 NOTE — Assessment & Plan Note (Signed)
Under good control on last check. Continue current regimen. To see endocrinology next week.

## 2016-02-12 ENCOUNTER — Ambulatory Visit: Payer: Self-pay | Admitting: Physician Assistant

## 2016-02-12 DIAGNOSIS — E1342 Other specified diabetes mellitus with diabetic polyneuropathy: Secondary | ICD-10-CM

## 2016-02-12 NOTE — Progress Notes (Signed)
Assessment:  Secondary diabetes with peripheral neuropathy (HCC) - Plan: B12, Urine Microalbumin w/creat. ratio       Plan:    1.  Rx changes none STALEY BUDZINSKI attended the shared medical appointment on @. Topics that were discussed tonight include diet, exercise, taking medications, blood glucose pattern recognition, meaning of laboratory results. Additional topics included: none.   2.  Education: Reviewed diabetes management:  Appropriate A1C target, avoid hypoglycemia,  blood pressure (<140/80), and cholesterol (LDL <100). -   Reviewed importance of good glycemic control to reduce risk for complications.   3.   Get regular physical activity at least 30 minutes a day of moderate intensity most days of the week  This discussion took at least 15 minutes out of a total visit time of 25 minutes today.  4. Follow up: I recommend patient follow-up with me at 2 months.   5. Referrals: none     Subjective:    Stacey Everett is a 55 y.o. female who is seen in follow up forType 2 diabetes.   Stacey Everett is performing SMBG on average 1 times per day.   Average over last 7 days is 130  Reports no symptoms of hypoglycemia.    The patient's history was reviewed and updated as appropriate.  Allergies  Allergen Reactions  . Nitrofurantoin Shortness Of Breath    Other  . Azithromycin   . Celebrex [Celecoxib]   . Paroxetine   . Paxil [Paroxetine Hcl]   . Salmeterol   . Sulfur     Other     Current outpatient prescriptions:  .  albuterol (PROVENTIL HFA;VENTOLIN HFA) 108 (90 Base) MCG/ACT inhaler, Inhale 2 puffs into the lungs every 6 (six) hours as needed for wheezing or shortness of breath (6 puffs every 6 hours as needed.)., Disp: 8 g, Rfl: 90 .  amoxicillin-clavulanate (AUGMENTIN) 875-125 MG tablet, Take 1 tablet by mouth 2 (two) times daily., Disp: 20 tablet, Rfl: 0 .  atorvastatin (LIPITOR) 20 MG tablet, Take 20 mg by mouth daily.  Reported on 01/10/2016, Disp: , Rfl:  .  busPIRone (BUSPAR) 10 MG tablet, Take 10 mg by mouth 2 (two) times daily., Disp: , Rfl:  .  cyclobenzaprine (FLEXERIL) 5 MG tablet, Take 2 tablets (10 mg total) by mouth at bedtime as needed for muscle spasms. 1 by mouth at bedtime as needed for back pain., Disp: 30 tablet, Rfl: 3 .  diclofenac sodium (VOLTAREN) 1 % GEL, Apply 2 g topically 4 (four) times daily. Apply 2 grams to affected joint up to 4 times daily for pain., Disp: 100 g, Rfl: 1 .  fluticasone (FLONASE) 50 MCG/ACT nasal spray, Place 2 sprays into both nostrils daily. 1 spray in each nostril everyday., Disp: 16 g, Rfl: 12 .  gabapentin (NEURONTIN) 400 MG capsule, Take 1 capsule (400 mg total) by mouth 4 (four) times daily., Disp: 120 capsule, Rfl: 1 .  ipratropium-albuterol (DUONEB) 0.5-2.5 (3) MG/3ML SOLN, Take 3 mLs by nebulization every 6 (six) hours as needed (1 neb inhaled every 6 hours as needed SOB.)., Disp: 360 mL, Rfl: 1 .  Levothyroxine Sodium 100 MCG CAPS, Take 1 capsule by mouth daily before breakfast. 1 by mouth once a day for hypothyroidism., Disp: , Rfl:  .  metFORMIN (GLUCOPHAGE) 500 MG tablet, Take 1 tablet (500 mg total) by mouth 2 (two) times daily with a meal., Disp: 90 tablet, Rfl: 0 .  mometasone (ASMANEX 60 METERED DOSES) 220 MCG/INH inhaler, Inhale 2  puffs into the lungs 2 (two) times daily., Disp: 1 Inhaler, Rfl: 12 .  venlafaxine XR (EFFEXOR-XR) 75 MG 24 hr capsule, Take 75 mg by mouth daily with breakfast., Disp: , Rfl:  .  Vitamin D, Cholecalciferol, 1000 units TABS, Take 1 tablet by mouth daily. Reported on 02/07/2016, Disp: , Rfl:   Social History   Social History  . Marital Status: Married    Spouse Name: N/A  . Number of Children: N/A  . Years of Education: N/A   Social History Main Topics  . Smoking status: Current Every Day Smoker -- 0.50 packs/day for 14 years    Types: Cigarettes  . Smokeless tobacco: Never Used  . Alcohol Use: No  . Drug Use: No  .  Sexual Activity:    Partners: Male   Other Topics Concern  . Not on file   Social History Narrative    Family History  Problem Relation Age of Onset  . Lung cancer Father   . Diabetes Father   . Atrial fibrillation Father   . Heart attack Father   . Stroke Father   . Cancer Father   . Colon cancer Paternal Grandmother   . Cancer Sister     Review of Systems A 12 point review of systems was negative except for pertinent items noted in the HPI.   Objective:     Wt Readings from Last 3 Encounters:  02/07/16 200 lb (90.719 kg)  01/31/16 198 lb 8 oz (90.039 kg)  01/10/16 199 lb (90.266 kg)   There were no vitals taken for this visit.                                            Lab Review No components found for: A1C GLUCOSE (mg/dL)  Date Value  16/10/960405/04/2016 172*  10/25/2015 115*   CO2 (mmol/L)  Date Value  01/01/2016 25  10/25/2015 24   BUN (mg/dL)  Date Value  54/09/811905/04/2016 15  10/25/2015 15   CREATININE, SER (mg/dL)  Date Value  14/78/295605/04/2016 0.86  10/25/2015 0.68   No components found for: LDL,  LDLCALC,  LDLDIRECT Lab Results  Component Value Date   NA 138 01/01/2016   K 4.2 01/01/2016   CL 95* 01/01/2016   CO2 25 01/01/2016   BUN 15 01/01/2016   CREATININE 0.86 01/01/2016   GFRAA 88 01/01/2016   GFRNONAA 76 01/01/2016   CALCIUM 9.5 01/01/2016   ALBUMIN 4.3 01/01/2016     DIABETES MELLITUS RESULTS: Lab Results  Component Value Date   HGBA1C 6.8* 01/01/2016   HGBA1C 6.2* 10/25/2015   Lab Results  Component Value Date   LDLCALC 75 01/01/2016   CREATININE 0.86 01/01/2016   Lab Results  Component Value Date   CHOL 156 01/01/2016   Lab Results  Component Value Date   LDLCALC 75 01/01/2016   No components found for: CHOLLLDLDIRECT No components found for: MICROALB/CR Lab Results  Component Value Date   GFRAA 88 01/01/2016   GFRNONAA 76 01/01/2016  Abby Potasharla D Johnson-Ovando attended the shared medical appointment on 02/12/16.  Topics that were discussed tonight include diet, exercise, taking medications, blood glucose pattern recognition, meaning of laboratory results. .Marland Kitchen

## 2016-02-19 ENCOUNTER — Other Ambulatory Visit: Payer: Self-pay

## 2016-02-19 DIAGNOSIS — E1342 Other specified diabetes mellitus with diabetic polyneuropathy: Secondary | ICD-10-CM

## 2016-02-20 LAB — B12 AND FOLATE PANEL
Folate: 18.7 ng/mL (ref >3.0)
Vitamin B-12: 1509 pg/mL — ABNORMAL HIGH (ref 211–946)

## 2016-02-21 ENCOUNTER — Other Ambulatory Visit: Payer: Self-pay | Admitting: Urology

## 2016-02-28 ENCOUNTER — Other Ambulatory Visit: Payer: Self-pay

## 2016-03-04 ENCOUNTER — Other Ambulatory Visit: Payer: Self-pay | Admitting: Nurse Practitioner

## 2016-03-18 ENCOUNTER — Emergency Department: Payer: Self-pay

## 2016-03-18 ENCOUNTER — Emergency Department
Admission: EM | Admit: 2016-03-18 | Discharge: 2016-03-18 | Disposition: A | Discharge status: Home / Self Care | Payer: Self-pay | Attending: Emergency Medicine | Admitting: Emergency Medicine

## 2016-03-18 DIAGNOSIS — E119 Type 2 diabetes mellitus without complications: Secondary | ICD-10-CM | POA: Insufficient documentation

## 2016-03-18 DIAGNOSIS — S8391XA Sprain of unspecified site of right knee, initial encounter: Secondary | ICD-10-CM | POA: Insufficient documentation

## 2016-03-18 DIAGNOSIS — M1711 Unilateral primary osteoarthritis, right knee: Secondary | ICD-10-CM | POA: Insufficient documentation

## 2016-03-18 DIAGNOSIS — Z7951 Long term (current) use of inhaled steroids: Secondary | ICD-10-CM | POA: Insufficient documentation

## 2016-03-18 DIAGNOSIS — Y929 Unspecified place or not applicable: Secondary | ICD-10-CM | POA: Insufficient documentation

## 2016-03-18 DIAGNOSIS — J45909 Unspecified asthma, uncomplicated: Secondary | ICD-10-CM | POA: Insufficient documentation

## 2016-03-18 DIAGNOSIS — J449 Chronic obstructive pulmonary disease, unspecified: Secondary | ICD-10-CM | POA: Insufficient documentation

## 2016-03-18 DIAGNOSIS — Y939 Activity, unspecified: Secondary | ICD-10-CM | POA: Insufficient documentation

## 2016-03-18 DIAGNOSIS — Y999 Unspecified external cause status: Secondary | ICD-10-CM | POA: Insufficient documentation

## 2016-03-18 DIAGNOSIS — F17210 Nicotine dependence, cigarettes, uncomplicated: Secondary | ICD-10-CM | POA: Insufficient documentation

## 2016-03-18 DIAGNOSIS — X58XXXA Exposure to other specified factors, initial encounter: Secondary | ICD-10-CM | POA: Insufficient documentation

## 2016-03-18 DIAGNOSIS — E039 Hypothyroidism, unspecified: Secondary | ICD-10-CM | POA: Insufficient documentation

## 2016-03-18 HISTORY — DX: Fibromyalgia: M79.7

## 2016-03-18 MED ORDER — HYDROCODONE-ACETAMINOPHEN 5-325 MG PO TABS
1.0000 | ORAL_TABLET | Freq: Four times a day (QID) | ORAL | 0 refills | Status: AC | PRN
Start: 2016-03-18 — End: ?

## 2016-03-18 NOTE — ED Notes (Signed)
States she is having pain to right knee for the past week  Was standing on porch and she jumped back   States pain is at top of knee and spreads to lateral aspect of knee  No swelling noted

## 2016-03-18 NOTE — ED Triage Notes (Signed)
Pt c/o right knee pain since last night Monday.. States she was jumping out of the way when someone was attempting to get a bees nest down and twisted her leg.Stacey Everett

## 2016-03-18 NOTE — Discharge Instructions (Signed)
You need to follow up with [facility] doctor at [facility] for any continued problems. Take Norco for pain only as distracted one every 6 hours. Wear knee immobilizer for support. You did not sleep with this on. Ice and elevate as needed for pain and if swelling.

## 2016-03-18 NOTE — ED Provider Notes (Signed)
[facility] Emergency Department Provider Note  ____________________________________________  Time seen: Approximately 9:36 AM  I have reviewed the triage vital signs and the nursing notes.   HISTORY  Chief Complaint Knee Pain   HPI Stacey Everett is a 55 y.o. female is here complaint of right knee pain. Patient states that she stepped off a porch in which there was a nest of bees that her neighbor was blowing in her direction. Patient states that she did not actually jump off the porch. She has however continued to have pain and some minimal swelling on her right knee. Patient has continued to ambulate with her cane which she normally does on a regular basis. Patient also has a history of chronic low back pain and has been seen in [facility] for this.She states she has been taking Tylenol for her pain but is unable to take any NSAIDs or anything with aspirin in it. Currently she rates her pain as a 10 over 10. Pain is worse with standing or walking. There is some decreased pain with lying with leg elevated.   Past Medical History:  Diagnosis Date  . Asthma   . Diabetes mellitus without complication (HCC)   . Fibromyalgia   . GERD (gastroesophageal reflux disease)     Patient Active Problem List   Diagnosis Date Noted  . Tobacco abuse disorder 01/10/2016  . COPD (chronic obstructive pulmonary disease) (HCC) 11/21/2015  . Low back pain 11/21/2015  . Post traumatic stress disorder (PTSD) 11/08/2015  . Major depression (HCC) 11/08/2015  . Arthralgia 10/25/2015  . Sciatic pain 10/25/2015  . Secondary diabetes with peripheral neuropathy (HCC) 10/04/2015  . Hypothyroidism 10/04/2015  . High cholesterol 10/04/2015  . Arthritis 03/29/2014    History reviewed. No pertinent surgical history.  Current Outpatient Rx  . Order #: [id number] Class: Normal  . Order #: [id number] Class: Normal  . Order #: [id number] Class: Historical Med  . Order #:  [id number] Class: Historical Med  . Order #: [id number] Class: Print  . Order #: [id number] Class: Normal  . Order #: [id number] Class: Normal  . Order #: [id number] Class: Normal  . Order #: [id number] Class: Print  . Order #: [id number] Class: Normal  . Order #: [id number] Class: Historical Med  . Order #: [id number] Class: Normal  . Order #: [id number] Class: Normal  . Order #: [id number] Class: Historical Med  . Order #: [id number] Class: Historical Med    Allergies Nitrofurantoin; Azithromycin; Celebrex [celecoxib]; Paroxetine; Paxil [paroxetine hcl]; Salmeterol; and Sulfur  Family History  Problem Relation Age of Onset  . Lung cancer Father   . Diabetes Father   . Atrial fibrillation Father   . Heart attack Father   . Stroke Father   . Cancer Father   . Colon cancer Paternal Grandmother   . Cancer Sister     Social History Social History  Substance Use Topics  . Smoking status: Current Every Day Smoker    Packs/day: 0.50    Years: 14.00    Types: Cigarettes  . Smokeless tobacco: Never Used  . Alcohol use No    Review of Systems Constitutional: No fever/chills Cardiovascular: Denies chest pain. Respiratory: Denies shortness of breath. Gastrointestinal:  No nausea, no vomiting. Musculoskeletal: History chronic low back pain. Positive right knee pain. Skin: Negative for rash. Neurological: Negative for headaches. Positive chronic peripheral neuropathy secondary to diabetes. Endocrine:Positive diabetes mellitus.  10-point ROS otherwise negative.  ____________________________________________   PHYSICAL EXAM:  VITAL SIGNS: ED Triage Vitals [03/18/16 0911]  Enc Vitals Group  BP 125/85     Pulse Rate 90     Resp 17     Temp 98 F (36.7 C)     Temp Source Oral     SpO2 98 %     Weight 201 lb (91.2 kg)     Height  (1.6 m)     Head Circumference      Peak Flow      Pain Score 10     Pain Loc      Pain Edu?      Excl. in GC?     Constitutional: Alert and  oriented. Well appearing and in no acute distress. Eyes: Conjunctivae are normal. PERRL. EOMI. Head: Atraumatic. Nose: No congestion/rhinnorhea. Neck: No stridor.   Cardiovascular: Normal rate, regular rhythm. Grossly normal heart sounds.  Good peripheral circulation. Respiratory: Normal respiratory effort.  No retractions. Lungs CTAB. Gastrointestinal: Soft and nontender. No distention.  Musculoskeletal: Examination of right knee there is no gross deformity however there is decreased range of motion secondary to pain. There is no crepitus appreciated on this exam. No abrasions, ecchymosis, erythema was noted. Patient is able to stand and walk with the assistance of her cane. No soft tissue swelling and no lower leg edema was noted. On palpation of the lower back there is no point tenderness. Neurologic:  Normal speech and language. No gross focal neurologic deficits are appreciated. No gait instability. Skin:  Skin is warm, dry and intact. No rash noted. Psychiatric: Mood and affect are normal. Speech and behavior are normal.  ____________________________________________   LABS (all labs ordered are listed, but only abnormal results are displayed)  Labs Reviewed - No data to display  RADIOLOGY  Right knee x-ray per radiologist shows minor osteoarthritis present. I, Tommi Rumps, personally viewed and evaluated these images (plain radiographs) as part of my medical decision making, as well as reviewing the written report by the radiologist. ____________________________________________   PROCEDURES  Procedure(s) performed: None  Procedures  Critical Care performed: No  ____________________________________________   INITIAL IMPRESSION / ASSESSMENT AND PLAN / ED COURSE  Pertinent labs & imaging results that were available during my care of the patient were reviewed by me and considered in my medical decision making (see chart for details).    Clinical Course   Patient  will [facility] and also her neurologist. Patient desires a referral to the pain clinic here in [place] and will speak to her neurologist about that. Patient was discharged with prescription of Norco No. 15 to be taken every 6 hours if needed. She is also placed in a knee immobilizer for extra support. She is given instructions to ice elevate if needed.   ____________________________________________   FINAL CLINICAL IMPRESSION(S) / ED DIAGNOSES  Final diagnoses:  Right knee sprain, initial encounter  Osteoarthritis of right knee, unspecified osteoarthritis type      NEW MEDICATIONS STARTED DURING THIS VISIT:  Discharge Medication List as of 03/18/2016 10:51 AM    START taking these medications   Details  HYDROcodone-acetaminophen (NORCO/VICODIN) 5-325 MG tablet Take 1 tablet by mouth every 6 (six) hours as needed for moderate pain., Starting Tue 03/18/2016, Print         Note:  This document was prepared using Dragon voice recognition software and may include unintentional dictation errors.    Tommi Rumps, PA-C 03/18/16 1429    Nita Sickle, MD 03/18/16 [phone]

## 2016-03-20 ENCOUNTER — Ambulatory Visit: Payer: Self-pay | Admitting: Family Medicine

## 2016-03-20 DIAGNOSIS — E119 Type 2 diabetes mellitus without complications: Secondary | ICD-10-CM

## 2016-03-20 LAB — GLUCOSE, POCT (MANUAL RESULT ENTRY): POC GLUCOSE: 195 mg/dL — AB (ref 70–99)

## 2016-03-20 MED ORDER — ATORVASTATIN CALCIUM 20 MG PO TABS: 40.0000 mg | ORAL_TABLET | Freq: Every day | ORAL | 11 refills | Status: DC

## 2016-03-20 NOTE — Progress Notes (Signed)
Subjective:    Patient ID: Stacey Everett, female    DOB: 1960/10/03, 55 y.o.   MRN: [id number]  HPI   Patient presents today with pain in neck and leg. Knee is sprained. Patient has swelling in hands and feet.  Blood cholesterol is high. Patient has diabetes and high blood sugar. Patient has diabetic neuropathy.  Patient has asthma  Patient Active Problem List   Diagnosis Date Noted  . Tobacco abuse disorder 01/10/2016  . COPD (chronic obstructive pulmonary disease) (HCC) 11/21/2015  . Low back pain 11/21/2015  . Post traumatic stress disorder (PTSD) 11/08/2015  . Major depression (HCC) 11/08/2015  . Arthralgia 10/25/2015  . Sciatic pain 10/25/2015  . Secondary diabetes with peripheral neuropathy (HCC) 10/04/2015  . Hypothyroidism 10/04/2015  . High cholesterol 10/04/2015  . Arthritis 03/29/2014      Medication List       Accurate as of 03/20/16  7:17 PM. Always use your most recent med list.          albuterol 108 (90 Base) MCG/ACT inhaler Commonly known as:  PROVENTIL HFA;VENTOLIN HFA Inhale 2 puffs into the lungs every 6 (six) hours as needed for wheezing or shortness of breath (6 puffs every 6 hours as needed.).   amoxicillin-clavulanate 875-125 MG tablet Commonly known as:  AUGMENTIN Take 1 tablet by mouth 2 (two) times daily.   atorvastatin 20 MG tablet Commonly known as:  LIPITOR Take 20 mg by mouth daily. Reported on 01/10/2016   busPIRone 10 MG tablet Commonly known as:  BUSPAR Take 10 mg by mouth 2 (two) times daily.   cyclobenzaprine 5 MG tablet Commonly known as:  FLEXERIL Take 2 tablets (10 mg total) by mouth at bedtime as needed for muscle spasms. 1 by mouth at bedtime as needed for back pain.   diclofenac sodium 1 % Gel Commonly known as:  VOLTAREN Apply 2 g topically 4 (four) times daily. Apply 2 grams to affected joint up to 4 times daily for pain.   fluticasone 50 MCG/ACT nasal spray Commonly known as:  FLONASE Place 2 sprays into  both nostrils daily. 1 spray in each nostril everyday.   gabapentin 400 MG capsule Commonly known as:  NEURONTIN Take 1 capsule (400 mg total) by mouth 4 (four) times daily.   HYDROcodone-acetaminophen 5-325 MG tablet Commonly known as:  NORCO/VICODIN Take 1 tablet by mouth every 6 (six) hours as needed for moderate pain.   ipratropium-albuterol 0.5-2.5 (3) MG/3ML Soln Commonly known as:  DUONEB Take 3 mLs by nebulization every 6 (six) hours as needed (1 neb inhaled every 6 hours as needed SOB.).   Levothyroxine Sodium 100 MCG Caps Take 1 capsule by mouth daily before breakfast. 1 by mouth once a day for hypothyroidism.   metFORMIN 500 MG tablet Commonly known as:  GLUCOPHAGE Take 1 tablet (500 mg total) by mouth 2 (two) times daily with a meal.   mometasone 220 MCG/INH inhaler Commonly known as:  ASMANEX 60 METERED DOSES Inhale 2 puffs into the lungs 2 (two) times daily.   venlafaxine XR 75 MG 24 hr capsule Commonly known as:  EFFEXOR-XR Take 75 mg by mouth daily with breakfast.   Vitamin D (Cholecalciferol) 1000 units Tabs Take 1 tablet by mouth daily. Reported on 02/07/2016         Review of Systems  Constitutional: Negative.   Eyes: Negative.   Respiratory: Negative.   Cardiovascular: Negative.   Gastrointestinal: Negative.   Endocrine: Negative.   Allergic/Immunologic: Negative.  Psychiatric/Behavioral: Negative.        Objective:   Physical Exam  Constitutional: She is oriented to person, place, and time. She appears well-developed and well-nourished.  HENT:  Head: Normocephalic and atraumatic.  Eyes: Conjunctivae are normal. No scleral icterus.  Neck: Neck supple. No thyromegaly present.  Cardiovascular: Normal rate, regular rhythm and normal heart sounds.   Pulmonary/Chest: Effort normal. She has wheezes.  Abdominal: Soft.  Lymphadenopathy:    She has no cervical adenopathy.  Neurological: She is alert and oriented to person, place, and time.   Skin: Skin is warm and dry.  Psychiatric: She has a normal mood and affect. Her behavior is normal. Judgment and thought content normal.  she has expiratory wheezes  BP 132/77   Pulse 94   Wt 201 lb (91.2 kg)   BMI 35.61 kg/m         Assessment & Plan:  TIIDM COPD HLD  Visit diagnosis: diabates Patient encouraged to quit smoking I have done the exam and reviewed the above chart and it is accurate to the best of my knowledge. R GilbertMD Follow up in 1 month: Labs in 1 month:  Lipids, CMET, A1C

## 2016-03-20 NOTE — Patient Instructions (Signed)
Follow up in 1 month with labs: lipids, Met C, Hba1c

## 2016-03-27 ENCOUNTER — Other Ambulatory Visit: Payer: Self-pay | Admitting: Urology

## 2016-04-01 ENCOUNTER — Encounter: Payer: Self-pay | Admitting: Physical Therapy

## 2016-04-01 ENCOUNTER — Ambulatory Visit: Payer: Self-pay | Attending: Pain Medicine

## 2016-04-01 DIAGNOSIS — M6281 Muscle weakness (generalized): Secondary | ICD-10-CM | POA: Insufficient documentation

## 2016-04-01 DIAGNOSIS — M542 Cervicalgia: Secondary | ICD-10-CM | POA: Insufficient documentation

## 2016-04-01 NOTE — Therapy (Signed)
[facility] [address] [place], [address] Phone: [phone]   Fax:  [phone]  Physical Therapy Evaluation  Patient Details  Name: Stacey Everett MRN: [id number] Date of Birth: May 26, 1961 Referring Provider: Mancel Parsons  Encounter Date: 04/01/2016      PT End of Session - 04/03/16 1402    Visit Number 1   Number of Visits 13   Date for PT Re-Evaluation 05/13/16   PT Start Time 0900   PT Stop Time 1000   PT Time Calculation (min) 60 min   Activity Tolerance Patient limited by pain   Behavior During Therapy [facility] for tasks assessed/performed      Past Medical History:  Diagnosis Date  . Asthma   . Diabetes mellitus without complication (HCC)   . Fibromyalgia   . GERD (gastroesophageal reflux disease)     History reviewed. No pertinent surgical history.  There were no vitals filed for this visit.       Subjective Assessment - 04/03/16 1348    Subjective Neck pain   Pertinent History Pt reports severe neck pain for over 1 year. No reported trauma. "I am full of osteoarthritis." Pt complains of central neck pain which radiates toward the L side. She was told by the doctor that she needed physical therapy so that her neck "didn't lock up." MD told her not to go to physical therapy at [facility] because it was too expensive. "Please don't hurt me today." Pt states she has PTSD and is very scared that physical therapy will hurt. Pt has a history of rotator cuff surgery and was told "never lift more than 5 pounds." Pt reports that she has L shoulder pain as well but has not had surgery. She has a history of neuropathy in bilateral feet/legs as well as hands. Denies N/T in LUE. Patient is emotional during history reporting that her sister passed away yesterday. Pt is awaiting to schedule an appointment at the pain clinic when her charity care is approved. They already have a referral to schedule an  appointment for her. She has a court date on 04/23/16 for disability and has already been turned down twice prior for disability. ROS negative for red flags.    Currently in Pain? Yes   Pain Score 7   Worst: 9/10, Best: 5/10   Pain Location Neck   Pain Orientation Posterior  Central   Pain Descriptors / Indicators Burning;Stabbing   Pain Type Chronic pain   Pain Radiating Towards Toward the L shoulder. Does not radiate into the LUE   Pain Onset More than a month ago   Pain Frequency Constant   Aggravating Factors  All movement of neck, constant pain at rest holding head in neutral position   Pain Relieving Factors heat, voltaren gel, pain medication, sitting up   Multiple Pain Sites No            [facility] PT Assessment - 04/03/16 1352      Assessment   Medical Diagnosis Neck pain, chronic bilatera low back pain without sciatica   Onset Date/Surgical Date 04/02/15  Approximated. Pt reports >1 year of pain   Hand Dominance Right   Prior Therapy None     Precautions   Precautions None   Precaution Comments Pt wearing brace on R lower leg. Reports recent R ankle sprain. Pt unable to provide details. Brace does not cross over ankle or knee joint.      Restrictions  Weight Bearing Restrictions No     Balance Screen   Has the patient fallen in the past 6 months Yes   How many times? 4  In the last 12 months   Has the patient had a decrease in activity level because of a fear of falling?  Yes     Home Environment   Living Environment Private residence   Living Arrangements Spouse/significant other   Available Help at Discharge Family   Type of Home House   Home Access Stairs to enter   Entrance Stairs-Number of Steps 4   Home Layout One level     Prior Function   Level of Independence Independent   Vocation Unemployed   Vocation Requirements Applying for disability     Cognition   Overall Cognitive Status No family/caregiver present to determine baseline cognitive  functioning   Attention Divided   Divided Attention Impaired   Divided Attention Impairment Verbal complex   Memory Appears intact   Awareness Appears intact   Problem Solving Impaired   Behaviors Restless     Observation/Other Assessments   Other Surveys  Other Surveys   Neck Disability Index  52%     Sensation   Light Touch Appears Intact     ROM / Strength   AROM / PROM / Strength AROM;Strength     AROM   Overall AROM Comments Pt is very guarded and painful with cervical AROM in all planes   AROM Assessment Site Cervical   Cervical Flexion 35   Cervical Extension 22   Cervical - Right Side Bend 25   Cervical - Left Side Bend 30   Cervical - Right Rotation 30   Cervical - Left Rotation 37     Strength   Overall Strength Comments Severely diminished grip strength bilateral hands   Strength Assessment Site Shoulder;Elbow   Right/Left Shoulder Right;Left   Right Shoulder Flexion 3+/5   Right Shoulder ABduction 4-/5   Left Shoulder Flexion 4-/5   Left Shoulder ABduction 4-/5   Right/Left Elbow Right;Left   Right Elbow Flexion 4/5   Right Elbow Extension 4/5   Left Elbow Flexion 4/5   Left Elbow Extension 4/5     Palpation   Palpation comment Pt reports pain with palpation along parapsinals. Very guarded and anxious during palpation     Special Tests    Special Tests --  Deferred special testing due to pain     Ambulation/Gait   Gait Comments Independent with all transfers and ambulation without assistive device         Pt instructed in HEP including cervical retraction, cervical AROM rotation with gentle OP, and scapular retractions                  PT Education - 04/03/16 1402    Education provided Yes   Education Details HEP, plan of care   Person(s) Educated Patient   Methods Explanation;Demonstration;Tactile cues;Verbal cues   Comprehension Verbalized understanding;Returned demonstration;Verbal cues required;Need further instruction              PT Long Term Goals - 04/03/16 1423      PT LONG TERM GOAL #1   Title Pt will be independent with HEP in order to decrease pain and improve function with household responsibilties   Time 6   Period Weeks   Status New     PT LONG TERM GOAL #2   Title Pt will improve cervical AROM bilaterally by at least 10 degrees  in order to be able to comfortably look over her shoulder when driving   Baseline 1/6/108/8/17: R: 30, L: 37   Time 6   Period Weeks   Status New     PT LONG TERM GOAL #3   Title Pt will decrease score on NDI by at least 19% in order to demonstrate clinically significant reduction in neck pain   Baseline 04/01/16: 52%   Time 6   Period Weeks   Status New               Plan - 04/03/16 1408    Clinical Impression Statement Pt was referred to physical therapy for neck pain and chronic bilateral low back pain. Pt reports minimal concern over low back pain today but states that her neck pain limits her ability to perform IADLs such as driving. Pt reports severe neck pain for over 1 year. She denies any radicular symptoms. Pt is very guarded and anxious today during evaluation. She is tearful at times due to the passing of her sister yesterday. Cervical AROM is limited and painful in all planes. She is painful to palpation along cervical paraspinals. Decreased UE strength bilaterally but no focal weakness. Denies numbness/tingling in bilateral UEs. No abnormal neurological signs. Examination is limited today due to high levels of pain and anxiety. Pt will benefit from skilled PT services to address deficits in pain, strength, and range of motion in order to improve function at home.    Rehab Potential Fair   Clinical Impairments Affecting Rehab Potential Positive: motivation; Negative: chronicity   PT Frequency 2x / week   PT Duration 6 weeks   PT Treatment/Interventions ADLs/Self Care Home Management;Aquatic Therapy;Biofeedback;Cryotherapy;Electrical  Stimulation;Iontophoresis 4mg /ml Dexamethasone;Moist Heat;Traction;Ultrasound;Gait training;Functional mobility training;Therapeutic activities;Therapeutic exercise;Balance training;Neuromuscular re-education;Patient/family education;Manual techniques;Passive range of motion;Dry needling   PT Next Visit Plan Continue to encourage cervical AROM. Gentle manual techniques and upper quarter strengthening   PT Home Exercise Plan Cervical retractions, scapular retractions, cervical AROM rotation with gentle overpressure      Patient will benefit from skilled therapeutic intervention in order to improve the following deficits and impairments:  Pain, Decreased range of motion, Decreased strength  Visit Diagnosis: Cervicalgia - Plan: PT plan of care cert/re-cert  Muscle weakness (generalized) - Plan: PT plan of care cert/re-cert     Problem List Patient Active Problem List   Diagnosis Date Noted  . Tobacco abuse disorder 01/10/2016  . COPD (chronic obstructive pulmonary disease) (HCC) 11/21/2015  . Low back pain 11/21/2015  . Post traumatic stress disorder (PTSD) 11/08/2015  . Major depression (HCC) 11/08/2015  . Arthralgia 10/25/2015  . Sciatic pain 10/25/2015  . Secondary diabetes with peripheral neuropathy (HCC) 10/04/2015  . Hypothyroidism 10/04/2015  . High cholesterol 10/04/2015  . Arthritis 03/29/2014   Lynnea MaizesJason D Huprich PT, DPT   Huprich,Jason 04/03/2016, 2:29 PM  [facility] [address] [place], [place], [address] Phone: [phone]   Fax:  [phone]  Name: Irving ShowsCarla D Everett MRN: [id number] Date of Birth: 08/05/1961

## 2016-04-01 NOTE — Patient Instructions (Signed)
Neck: Retraction    Sit with back and head straight. Pull chin back to line up ear with shoulder. Do not turn or tilt head. May use hand to add a little gentle pressure. Hold _3___ seconds. Repeat _10___ times. Do __2__ sessions per day. CAUTION: Movement should be gentle, steady and slow.   Active Neck Rotation    With head in a comfortable position and chin gently tucked in, rotate head to the right. Hold _3___ seconds. Repeat to the left. Use a hand to add a little gentle assist. Repeat __10__ times each direction. Do __2__ sessions per day.   Scapular Retraction (Standing)    With arms at sides, pinch shoulder blades together. Repeat __10__ times. Do __2__ sessions per day.

## 2016-04-02 ENCOUNTER — Other Ambulatory Visit: Payer: Self-pay | Admitting: Urology

## 2016-04-03 ENCOUNTER — Telehealth: Payer: Self-pay

## 2016-04-03 ENCOUNTER — Other Ambulatory Visit: Payer: Self-pay

## 2016-04-03 MED ORDER — LEVOTHYROXINE SODIUM 100 MCG PO CAPS: 1.0000 | ORAL_CAPSULE | Freq: Every day | ORAL | 3 refills | Status: DC

## 2016-04-03 NOTE — Telephone Encounter (Signed)
Stacey Everett told Stacey Everett that diclofenac gel is a non-formulary item and out of the approved price range so the patient will have to pay for it out of pocket.

## 2016-04-08 ENCOUNTER — Ambulatory Visit: Payer: Self-pay | Admitting: Physical Therapy

## 2016-04-08 ENCOUNTER — Ambulatory Visit: Payer: Self-pay

## 2016-04-10 ENCOUNTER — Ambulatory Visit: Payer: Self-pay | Admitting: Physical Therapy

## 2016-04-10 ENCOUNTER — Other Ambulatory Visit: Payer: Self-pay | Admitting: Urology

## 2016-04-15 ENCOUNTER — Ambulatory Visit: Payer: Self-pay | Admitting: Physical Therapy

## 2016-04-15 DIAGNOSIS — M542 Cervicalgia: Secondary | ICD-10-CM

## 2016-04-15 DIAGNOSIS — M6281 Muscle weakness (generalized): Secondary | ICD-10-CM

## 2016-04-15 NOTE — Patient Instructions (Signed)
Soft tissue mobilization on peri-spinal musculature and rhomboid region of cervical/thoracic spine, patient reported this felt good, educated she may be sore from this tomorrow.   Educated patient on posture, significantly forward head, rounded shoulder, and thoracic spine kyphosis noted at all times, increases with conversation. Educated to alternate between her "normal" posture and finding a pain free range to more neutral position. Attempted to have patient complete scapular retractions which she began shrugging shoulders, minimal movement at scapula noted. Instead PT assisted with direction via tactile cuing, which modestly improved palpation of scapular movement.   Attempted yellow t-band rows, she has difficulty with mass grip of RUE she began grinning and had severe forward head posture , required significant verbal cuing with technique on bending elbows and squeezing shoulder blades to bring yellow band posteriorly. 2 bouts total of 8 repetitions with minimal if any tension in yellow band performed.   Educated patient extensively on what PT can help with, patient informed anxiety and depression can exacerbate pain, attempt to find some things at home she finds enjoyable. Patient informed to consider aquatic therapy due to pain in multiple areas.

## 2016-04-15 NOTE — Therapy (Signed)
[facility] PH[facility]ICINE [address] [place], [place], [address] Phone: [phone]   Fax:  [phone]  Physical Therapy Treatment  Patient Details  Name: Stacey ShowsCarla D Everett MRN: [id number] Date of Birth: 04/03/1961 Referring Provider: Mancel ParsonsGalang, Stacey  Encounter Date: 04/15/2016      PT End of Session - 04/15/16 0939    Visit Number 2   Number of Visits 13   Date for PT Re-Evaluation 05/13/16   PT Start Time 0855   PT Stop Time 0939   PT Time Calculation (min) 44 min   Activity Tolerance Patient limited by pain   Behavior [facility] for tasks assessed/performed      Past Medical History:  Diagnosis Date  . Asthma   . Diabetes mellitus without complication (HCC)   . Fibromyalgia   . GERD (gastroesophageal reflux disease)     No past surgical history on file.  There were no vitals filed for this visit.      Subjective Assessment - 04/15/16 0923    Subjective Patient reports she has had severe IBS and neck pain over the past few days. She provides a history of pain in R ankle, R knee, lumbar spine, bilateral UEs, neck --stabbing. She reports she has been told by multiple providers that she has OA in her hands, neck, lumbar spine. She reports ice makes her pain in the neck worse     Pertinent History Pt reports severe neck pain for over 1 year. No reported trauma. "I am full of osteoarthritis." Pt complains of central neck pain which radiates toward the L side. She was told by the doctor that she needed physical therapy so that her neck "didn't lock up." MD [facility]. "Please don't hurt me today." Pt states she has PTSD and is very scared that physical therapy will hurt. Pt has a history of rotator cuff surgery and was told "never lift more than 5 pounds." Pt reports that she has L shoulder pain as well but has not had surgery. She has a history of  neuropathy in bilateral feet/legs as well as hands. Denies N/T in LUE. Patient is emotional during history reporting that her sister passed away yesterday. Pt is awaiting to schedule an appointment at the pain clinic when her charity care is approved. They already have a referral to schedule an appointment for her. She has a court date on 04/23/16 for disability and has already been turned down twice prior for disability. ROS negative for red flags.    Currently in Pain? Yes   Pain Score --  Patient reports she has had severe stabbing neck pain   Pain Location Neck   Pain Orientation Lower;Posterior   Pain Descriptors / Indicators Burning;Stabbing   Pain Type Chronic pain   Pain Radiating Towards L shoulder, bilateral hands, R knee, R ankle.    Pain Onset More than a month ago   Pain Frequency Constant   Aggravating Factors  All movement of neck.    Pain Relieving Factors Heat, voltaren gel, pain medication, sitting up.         Soft tissue mobilization on peri-spinal musculature and rhomboid region of cervical/thoracic spine, patient reported this felt good, educated she may be sore from this tomorrow.   Educated patient on posture, significantly forward head, rounded shoulder, and thoracic spine kyphosis noted at all times, increases with conversation. Educated to alternate  between her "normal" posture and finding a pain free range to more neutral position. Attempted to have patient complete scapular retractions which she began shrugging shoulders, minimal movement at scapula noted. Instead PT assisted with direction via tactile cuing, which modestly improved palpation of scapular movement.   Attempted yellow t-band rows, she has difficulty with mass grip of RUE she began grinning and had severe forward head posture , required significant verbal cuing with technique on bending elbows and squeezing shoulder blades to bring yellow band posteriorly. 2 bouts total of 8 repetitions with minimal if  any tension in yellow band performed.   Educated patient extensively on what PT can help with, patient informed anxiety and depression can exacerbate pain, attempt to find some things at home she finds enjoyable. Patient informed to consider aquatic therapy due to pain in multiple areas.                           PT Education - 04/15/16 [phone]    Education provided Yes   Education Details Discontinue previous HEP, find something she enjoys at home, may try aquatic therapy.    Person(s) Educated Patient   Methods Explanation;Demonstration;Tactile cues;Verbal cues;Handout   Comprehension Need further instruction;Verbal cues required;Returned demonstration;Verbalized understanding;Tactile cues required             PT Long Term Goals - 04/03/16 1423      PT LONG TERM GOAL #1   Title Pt will be independent with HEP in order to decrease pain and improve function with household responsibilties   Time 6   Period Weeks   Status New     PT LONG TERM GOAL #2   Title Pt will improve cervical AROM bilaterally by at least 10 degrees in order to be able to comfortably look over her shoulder when driving   Baseline 05/01/03: R: 30, L: 37   Time 6   Period Weeks   Status New     PT LONG TERM GOAL #3   Title Pt will decrease score on NDI by at least 19% in order to demonstrate clinically significant reduction in neck pain   Baseline 04/01/16: 52%   Time 6   Period Weeks   Status New               Plan - 04/15/16 5409    Clinical Impression Statement Patient reports significant and long standing orthopedic and neuromuscular history of pain in multiple joints and peripheral neuropathy in all peripheral extremities. She is very guarded with all movements, fearful, anxious, and appears to have depressed affect. She struggles to perform even basic exercises for scapular mobility due to reports of pain and her anxiety limits her ability to follow commands. Prognosis is quite  guarded at this time given psychosocial history and poor tolerance for exercise in this session.    Rehab Potential Fair   Clinical Impairments Affecting Rehab Potential Positive: motivation; Negative: chronicity   PT Frequency 2x / week   PT Duration 6 weeks   PT Treatment/Interventions ADLs/Self Care Home Management;Aquatic Therapy;Biofeedback;Cryotherapy;Electrical Stimulation;Iontophoresis 4mg /ml Dexamethasone;Moist Heat;Traction;Ultrasound;Gait training;Functional mobility training;Therapeutic activities;Therapeutic exercise;Balance training;Neuromuscular re-education;Patient/family education;Manual techniques;Passive range of motion;Dry needling   PT Next Visit Plan Continue to encourage cervical AROM. Gentle manual techniques and upper quarter strengthening   PT Home Exercise Plan Scapular retractions, rows with yellow t-bamd    Consulted and Agree with Plan of Care Patient      Patient will benefit from skilled  therapeutic intervention in order to improve the following deficits and impairments:  Pain, Decreased range of motion, Decreased strength  Visit Diagnosis: Cervicalgia  Muscle weakness (generalized)     Problem List Patient Active Problem List   Diagnosis Date Noted  . Tobacco abuse disorder 01/10/2016  . COPD (chronic obstructive pulmonary disease) (HCC) 11/21/2015  . Low back pain 11/21/2015  . Post traumatic stress disorder (PTSD) 11/08/2015  . Major depression (HCC) 11/08/2015  . Arthralgia 10/25/2015  . Sciatic pain 10/25/2015  . Secondary diabetes with peripheral neuropathy (HCC) 10/04/2015  . Hypothyroidism 10/04/2015  . High cholesterol 10/04/2015  . Arthritis 03/29/2014   Kerin RansomPatrick A Cherysh Epperly, PT, DPT    04/15/2016, 10:43 AM  [facility] [address] [place], [place], [address] Phone: [phone]   Fax:  [phone]  Name: Stacey ShowsCarla D Everett MRN: [id number] Date of Birth:  11/08/1960

## 2016-04-17 ENCOUNTER — Ambulatory Visit: Payer: Self-pay | Admitting: Physical Therapy

## 2016-04-24 ENCOUNTER — Ambulatory Visit: Payer: Self-pay | Admitting: Nurse Practitioner

## 2016-04-24 VITALS — BP 151/101 | HR 94 | Wt 194.0 lb

## 2016-04-24 DIAGNOSIS — Z720 Tobacco use: Secondary | ICD-10-CM

## 2016-04-24 DIAGNOSIS — E538 Deficiency of other specified B group vitamins: Secondary | ICD-10-CM

## 2016-04-24 DIAGNOSIS — M5430 Sciatica, unspecified side: Secondary | ICD-10-CM

## 2016-04-24 MED ORDER — NICOTINE 10 MG IN INHA: RESPIRATORY_TRACT | 0 refills | Status: DC

## 2016-04-24 MED ORDER — CYANOCOBALAMIN 1000 MCG/ML IJ SOLN: 1000.0000 ug | INTRAMUSCULAR | Status: AC

## 2016-04-24 NOTE — Progress Notes (Unsigned)
   Now down to 8 cigarettes per day  Upon review of records shown to have a very low b12 level.  At 1.509   Wants to quit smoking, but can not afford patches.    Ongoing severe neck pain requesting PT  Pain involving hands and knees.    Exam:    Alert, anxious, in no acute distress, verbally appropriate, in no acute distress, with multitude of complaints   States she wants to quit smoking  No carotid bruits No carotid bruits or adenopathy BBrS clear though diminished as expected in a smoker AP RRR PT pulses palpable x 2   Assess/Plan:   Vitamin b12 deficiency:  Gave 1000 mcg each  Month and recheck b12 level in 6 months.    [facility].    Smoking cessation will prescribe nicotrol inhalers, pt instructed that she can not smoke and use inhalers at the same time.   Remaining labs need to be done

## 2016-05-08 ENCOUNTER — Other Ambulatory Visit: Payer: Self-pay

## 2016-05-08 ENCOUNTER — Ambulatory Visit: Payer: Self-pay | Admitting: Ophthalmology

## 2016-05-08 DIAGNOSIS — J449 Chronic obstructive pulmonary disease, unspecified: Secondary | ICD-10-CM

## 2016-05-08 MED ORDER — IPRATROPIUM-ALBUTEROL 0.5-2.5 (3) MG/3ML IN SOLN: 0.5000 mL | Freq: Four times a day (QID) | RESPIRATORY_TRACT | 0 refills | Status: DC | PRN

## 2016-05-13 ENCOUNTER — Ambulatory Visit: Payer: Self-pay

## 2016-05-21 ENCOUNTER — Other Ambulatory Visit: Payer: Self-pay | Admitting: Urology

## 2016-05-21 DIAGNOSIS — J449 Chronic obstructive pulmonary disease, unspecified: Secondary | ICD-10-CM

## 2016-05-22 ENCOUNTER — Ambulatory Visit: Payer: Self-pay | Admitting: Nurse Practitioner

## 2016-05-22 VITALS — BP 137/86 | HR 98 | Ht 63.0 in

## 2016-05-22 DIAGNOSIS — M2550 Pain in unspecified joint: Secondary | ICD-10-CM

## 2016-05-22 DIAGNOSIS — J449 Chronic obstructive pulmonary disease, unspecified: Secondary | ICD-10-CM

## 2016-05-22 MED ORDER — IPRATROPIUM-ALBUTEROL 0.5-2.5 (3) MG/3ML IN SOLN: 0.5000 mL | Freq: Four times a day (QID) | RESPIRATORY_TRACT | 5 refills | Status: DC | PRN

## 2016-05-22 MED ORDER — CYANOCOBALAMIN 1000 MCG/ML IJ SOLN: 1000.0000 ug | Freq: Once | INTRAMUSCULAR | Status: AC

## 2016-05-22 MED ORDER — GABAPENTIN 300 MG PO CAPS: 300.0000 mg | ORAL_CAPSULE | ORAL | 5 refills | Status: DC

## 2016-05-22 NOTE — Patient Instructions (Addendum)
Labs today: A1c, lipid, Cmet b12 injection Follow up in a month for b12 shot

## 2016-05-22 NOTE — Progress Notes (Unsigned)
Subjective:    Patient ID: Stacey Everett, female    DOB: 08/17/1961, 55 y.o.   MRN: [id number]  HPI Patient following up with diabetes. Blood glucose of 130 is low for her.  Patient last b12 injection was 20 days ago.  Patient continues to smoke and was encouraged to quit smoking Patients previously used nicotine gum and did not continue using it as it hurt her teeth  Patient reports pain in her hands due to carpal tunnel Medications refilled  Patient Active Problem List   Diagnosis Date Noted  . Tobacco abuse disorder 01/10/2016  . COPD (chronic obstructive pulmonary disease) (HCC) 11/21/2015  . Low back pain 11/21/2015  . Post traumatic stress disorder (PTSD) 11/08/2015  . Major depression (HCC) 11/08/2015  . Arthralgia 10/25/2015  . Sciatic pain 10/25/2015  . Secondary diabetes with peripheral neuropathy (HCC) 10/04/2015  . Hypothyroidism 10/04/2015  . High cholesterol 10/04/2015  . Arthritis 03/29/2014     Medication List       Accurate as of 05/22/16  6:49 PM. Always use your most recent med list.          albuterol 108 (90 Base) MCG/ACT inhaler Commonly known as:  PROVENTIL HFA;VENTOLIN HFA Inhale 2 puffs into the lungs every 6 (six) hours as needed for wheezing or shortness of breath (6 puffs every 6 hours as needed.).   atorvastatin 20 MG tablet Commonly known as:  LIPITOR Take 2 tablets (40 mg total) by mouth daily. Reported on 01/10/2016   busPIRone 10 MG tablet Commonly known as:  BUSPAR Take 10 mg by mouth 2 (two) times daily.   cyclobenzaprine 5 MG tablet Commonly known as:  FLEXERIL Take 2 tablets (10 mg total) by mouth at bedtime as needed for muscle spasms. 1 by mouth at bedtime as needed for back pain.   diclofenac sodium 1 % Gel Commonly known as:  VOLTAREN Apply 2 g topically 4 (four) times daily. Apply 2 grams to affected joint up to 4 times daily for pain.   fluticasone 50 MCG/ACT nasal spray Commonly known as:  FLONASE Place 2  sprays into both nostrils daily. 1 spray in each nostril everyday.   gabapentin 300 MG capsule Commonly known as:  NEURONTIN Take 1 capsule (300 mg total) by mouth as directed. Take two tablets three times a day   HYDROcodone-acetaminophen 5-325 MG tablet Commonly known as:  NORCO/VICODIN Take 1 tablet by mouth every 6 (six) hours as needed for moderate pain.   ipratropium-albuterol 0.5-2.5 (3) MG/3ML Soln Commonly known as:  DUONEB Take 0.5-2.5 mLs by nebulization every 6 (six) hours as needed.   Levothyroxine Sodium 100 MCG Caps Take 1 capsule (100 mcg total) by mouth daily before breakfast. 1 by mouth once a day for hypothyroidism.   metFORMIN 500 MG tablet Commonly known as:  GLUCOPHAGE TAKE 1 TABLET TWICE DAILY WITH MEALS.   mometasone 220 MCG/INH inhaler Commonly known as:  ASMANEX 60 METERED DOSES Inhale 2 puffs into the lungs 2 (two) times daily.   nicotine 10 MG inhaler Commonly known as:  NICOTROL May inhale the contents of 6-12 inhalers per day, gradually decreasing over time   venlafaxine XR 75 MG 24 hr capsule Commonly known as:  EFFEXOR-XR Take 75 mg by mouth daily with breakfast.   Vitamin D (Cholecalciferol) 1000 units Tabs Take 1 tablet by mouth daily. Reported on 02/07/2016       Review of Systems     Objective:   Physical Exam  Constitutional:  She is oriented to person, place, and time.  Cardiovascular: Normal rate and regular rhythm.   Pulmonary/Chest: Effort normal and breath sounds normal.  Neurological: She is alert and oriented to person, place, and time.   BP 137/86   Pulse 98   Ht 5\' 3"  (1.6 m)   LMP  (Approximate) Comment: 1998 per pt        Assessment & Plan:  Medications refilled Patient encouraged to quit smoking  Labs today: A1c, Lipids, Cmet Follow up in 1 month for b12 injection Follow up in months for diabetes   b12 injection

## 2016-05-23 LAB — COMPREHENSIVE METABOLIC PANEL
A/G RATIO: 1.7 (ref 1.2–2.2)
ALBUMIN: 4.4 g/dL (ref 3.5–5.5)
ALT: 21 IU/L (ref 0–32)
AST: 19 IU/L (ref 0–40)
Alkaline Phosphatase: 84 IU/L (ref 39–117)
BUN / CREAT RATIO: 10 (ref 9–23)
BUN: 12 mg/dL (ref 6–24)
Bilirubin Total: 0.3 mg/dL (ref 0.0–1.2)
CALCIUM: 9.4 mg/dL (ref 8.7–10.2)
CO2: 28 mmol/L (ref 18–29)
Chloride: 97 mmol/L (ref 96–106)
Creatinine, Ser: 1.24 mg/dL — ABNORMAL HIGH (ref 0.57–1.00)
GFR, EST AFRICAN AMERICAN: 57 mL/min/{1.73_m2} — AB (ref >59)
GFR, EST NON AFRICAN AMERICAN: 49 mL/min/{1.73_m2} — AB (ref >59)
GLOBULIN, TOTAL: 2.6 g/dL (ref 1.5–4.5)
Glucose: 172 mg/dL — ABNORMAL HIGH (ref 65–99)
POTASSIUM: 4.9 mmol/L (ref 3.5–5.2)
SODIUM: 140 mmol/L (ref 134–144)
TOTAL PROTEIN: 7 g/dL (ref 6.0–8.5)

## 2016-05-23 LAB — LIPID PANEL
CHOLESTEROL TOTAL: 226 mg/dL — AB (ref 100–199)
Chol/HDL Ratio: 6.5 ratio units — ABNORMAL HIGH (ref 0.0–4.4)
HDL: 35 mg/dL — ABNORMAL LOW (ref >39)
LDL CALC: 126 mg/dL — AB (ref 0–99)
TRIGLYCERIDES: 323 mg/dL — AB (ref 0–149)
VLDL Cholesterol Cal: 65 mg/dL — ABNORMAL HIGH (ref 5–40)

## 2016-05-23 LAB — HEMOGLOBIN A1C
Est. average glucose Bld gHb Est-mCnc: 160 mg/dL
Hgb A1c MFr Bld: 7.2 % — ABNORMAL HIGH (ref 4.8–5.6)

## 2016-05-28 ENCOUNTER — Telehealth: Payer: Self-pay

## 2016-05-28 NOTE — Telephone Encounter (Signed)
Called pt to reschedule eye appt due to dr shields not being able to come in. Made new appt. Pt verbalized understanding.

## 2016-05-29 ENCOUNTER — Ambulatory Visit: Payer: Self-pay | Admitting: Ophthalmology

## 2016-06-12 ENCOUNTER — Ambulatory Visit: Payer: Self-pay | Admitting: Ophthalmology

## 2016-06-19 ENCOUNTER — Other Ambulatory Visit: Payer: Self-pay

## 2016-06-24 ENCOUNTER — Telehealth: Payer: Self-pay | Admitting: Urology

## 2016-06-24 NOTE — Telephone Encounter (Signed)
[facility]

## 2016-06-26 ENCOUNTER — Other Ambulatory Visit: Payer: Self-pay

## 2016-07-03 ENCOUNTER — Other Ambulatory Visit: Payer: Self-pay | Admitting: Internal Medicine

## 2016-07-03 ENCOUNTER — Ambulatory Visit: Payer: Self-pay

## 2016-07-07 ENCOUNTER — Other Ambulatory Visit: Payer: Self-pay | Admitting: Urology

## 2016-07-16 ENCOUNTER — Other Ambulatory Visit: Payer: Self-pay | Admitting: Internal Medicine

## 2016-07-16 DIAGNOSIS — J449 Chronic obstructive pulmonary disease, unspecified: Secondary | ICD-10-CM

## 2016-07-24 ENCOUNTER — Other Ambulatory Visit: Payer: Self-pay | Admitting: Nurse Practitioner

## 2016-08-05 ENCOUNTER — Other Ambulatory Visit: Payer: Self-pay | Admitting: Internal Medicine

## 2016-08-05 DIAGNOSIS — J449 Chronic obstructive pulmonary disease, unspecified: Secondary | ICD-10-CM

## 2016-08-12 ENCOUNTER — Ambulatory Visit: Payer: Self-pay | Admitting: Endocrinology

## 2016-08-12 VITALS — BP 136/87 | HR 111 | Ht 63.0 in | Wt 200.0 lb

## 2016-08-12 DIAGNOSIS — E7800 Pure hypercholesterolemia, unspecified: Secondary | ICD-10-CM

## 2016-08-12 DIAGNOSIS — M25542 Pain in joints of left hand: Secondary | ICD-10-CM

## 2016-08-12 DIAGNOSIS — E039 Hypothyroidism, unspecified: Secondary | ICD-10-CM

## 2016-08-12 DIAGNOSIS — Z720 Tobacco use: Secondary | ICD-10-CM

## 2016-08-12 DIAGNOSIS — E119 Type 2 diabetes mellitus without complications: Secondary | ICD-10-CM

## 2016-08-12 DIAGNOSIS — M25541 Pain in joints of right hand: Secondary | ICD-10-CM

## 2016-08-12 DIAGNOSIS — J449 Chronic obstructive pulmonary disease, unspecified: Secondary | ICD-10-CM

## 2016-08-12 LAB — POCT GLYCOSYLATED HEMOGLOBIN (HGB A1C): Hemoglobin A1C: 7.6

## 2016-08-12 LAB — GLUCOSE, POCT (MANUAL RESULT ENTRY): POC Glucose: 166 mg/dl — AB (ref 70–99)

## 2016-08-12 MED ORDER — FLUTICASONE PROPIONATE 50 MCG/ACT NA SUSP: 2.0000 | Freq: Every day | NASAL | 12 refills | Status: AC

## 2016-08-12 MED ORDER — MOMETASONE FUROATE 220 MCG/INH IN AEPB: 2.0000 | INHALATION_SPRAY | Freq: Two times a day (BID) | RESPIRATORY_TRACT | 12 refills | Status: AC

## 2016-08-12 MED ORDER — METFORMIN HCL 500 MG PO TABS: 1000.0000 mg | ORAL_TABLET | Freq: Every day | ORAL | 4 refills | Status: AC

## 2016-08-12 MED ORDER — VITAMIN B-12 1000 MCG PO TABS: 1000.0000 ug | ORAL_TABLET | Freq: Every day | ORAL | 4 refills | Status: AC

## 2016-08-12 MED ORDER — ALBUTEROL SULFATE HFA 108 (90 BASE) MCG/ACT IN AERS
2.0000 | INHALATION_SPRAY | Freq: Four times a day (QID) | RESPIRATORY_TRACT | 4 refills | Status: DC | PRN
Start: 2016-08-12 — End: 2024-05-19

## 2016-08-12 MED ORDER — FLUTICASONE-SALMETEROL 250-50 MCG/DOSE IN AEPB: 1.0000 | INHALATION_SPRAY | Freq: Two times a day (BID) | RESPIRATORY_TRACT | 4 refills | Status: AC

## 2016-08-12 MED ORDER — CYCLOBENZAPRINE HCL 10 MG PO TABS: 10.0000 mg | ORAL_TABLET | Freq: Every evening | ORAL | 3 refills | Status: AC | PRN

## 2016-08-12 MED ORDER — ATORVASTATIN CALCIUM 20 MG PO TABS: 40.0000 mg | ORAL_TABLET | Freq: Every day | ORAL | 4 refills | Status: AC

## 2016-08-12 MED ORDER — IPRATROPIUM-ALBUTEROL 0.5-2.5 (3) MG/3ML IN SOLN: 0.5000 mL | Freq: Four times a day (QID) | RESPIRATORY_TRACT | 5 refills | Status: AC | PRN

## 2016-08-12 MED ORDER — LEVOTHYROXINE SODIUM 100 MCG PO TABS: ORAL_TABLET | ORAL | 4 refills | Status: AC

## 2016-08-12 MED ORDER — DICLOFENAC SODIUM 1 % TD GEL: 2.0000 g | Freq: Four times a day (QID) | TRANSDERMAL | 12 refills | Status: AC

## 2016-08-12 MED ORDER — GABAPENTIN 300 MG PO CAPS: 900.0000 mg | ORAL_CAPSULE | Freq: Three times a day (TID) | ORAL | 4 refills | Status: AC

## 2016-08-12 NOTE — Addendum Note (Signed)
Addended by: Milana HuntsmanSIDDIQUE, Moksh Loomer F on: 08/12/2016 08:31 PM   Modules accepted: Orders

## 2016-08-12 NOTE — Progress Notes (Signed)
[facility] Progress Note  08/12/2016  Name: Irving ShowsCarla D Johnson-Asselin  MRN: [id number]  Date of Birth: 11/19/1960  Chief Complaint:  Chief Complaint  Patient presents with  . Diabetes    HPI: HPI  Past Medical History:  Past Medical History:  Diagnosis Date  . Asthma   . Diabetes mellitus without complication (HCC)   . Fibromyalgia   . GERD (gastroesophageal reflux disease)     Past Surgical History: No past surgical history on file.  Past Family History:  Family History  Problem Relation Age of Onset  . Lung cancer Father   . Diabetes Father   . Atrial fibrillation Father   . Heart attack Father   . Stroke Father   . Cancer Father   . Colon cancer Paternal Grandmother   . Cancer Sister     Diabetes Management:  Lab Results  Component Value Date   HGBA1C 7.2 (H) 05/22/2016    Meter here todayNo - never told  Hypoglycemia in the last 3 monthsYes - can get to 30's  Highest blood sugar seen in last 1-2 months? 215  Reported blood sugar average: less than 150  Trouble with managing blood sugar : hypoglycemia  Checks breakfast and bedtime  New Complaints: osteoarthritis, carpal tunnels and neuropathy  Personal Diabetes Goal: avoid hypo  Pe:  Normal vibration sensation.  I suspect that her symptoms are not related to diabetic neuropathy and it may be due to impingement syndromes or B12 deficiency.    Clinical Assessment & Plan  Patient Instructions  1. Stop smoking with nicotine patch when you get your insurance 2. Take B12 1000 mcg over-the-counter if you cannot [facility] as it will help with neuropathy 3. Refill prescriptions.

## 2016-08-12 NOTE — Patient Instructions (Signed)
1. Stop smoking with nicotine patch when you get your insurance 2. Take B12 1000 mcg over-the-counter if you cannot [facility] as it will help with neuropathy 3. Refill prescriptions.

## 2016-08-27 ENCOUNTER — Telehealth: Payer: Self-pay | Admitting: Pharmacist

## 2016-08-27 NOTE — Telephone Encounter (Signed)
Advair 250/50 refill PAP submitted to manufacturer today. °

## 2016-09-04 ENCOUNTER — Ambulatory Visit: Payer: Self-pay

## 2017-03-15 ENCOUNTER — Emergency Department
Admission: EM | Admit: 2017-03-15 | Discharge: 2017-03-15 | Disposition: A | Discharge status: Home / Self Care | Payer: Medicare Other | Attending: Emergency Medicine | Admitting: Emergency Medicine

## 2017-03-15 ENCOUNTER — Encounter: Payer: Self-pay | Admitting: Emergency Medicine

## 2017-03-15 DIAGNOSIS — L299 Pruritus, unspecified: Secondary | ICD-10-CM | POA: Insufficient documentation

## 2017-03-15 DIAGNOSIS — R21 Rash and other nonspecific skin eruption: Secondary | ICD-10-CM | POA: Diagnosis present

## 2017-03-15 DIAGNOSIS — Z5321 Procedure and treatment not carried out due to patient leaving prior to being seen by health care provider: Secondary | ICD-10-CM | POA: Diagnosis not present

## 2017-03-15 NOTE — ED Notes (Signed)
Pt up to nurses station with erratic/aggitated behavior stating "I'm leaving, I have to get home to my husband to give him his medicine so he doesn't take the wrong thing, I can go see Dr Luciano CutterHahn in the morning" I explained to pt that Rn was on her way in room to come see her and that it wouldn't be much longer before she was seen, pt then states " I don't care I'm gone, i'm just leaving as she hands this tech her ID bracelet" Rn Vernona Rieger(Laura) notified to pt leaving at this time

## 2017-03-15 NOTE — ED Triage Notes (Signed)
Pt reports rash and itching for 3 weeks. Has not used any otc meds. Pt has fine flat redness noted.

## 2017-04-13 ENCOUNTER — Emergency Department: Payer: Medicare Other

## 2017-04-13 ENCOUNTER — Encounter: Payer: Self-pay | Admitting: Medical Oncology

## 2017-04-13 ENCOUNTER — Emergency Department
Admission: EM | Admit: 2017-04-13 | Discharge: 2017-04-13 | Disposition: A | Discharge status: Home / Self Care | Payer: Medicare Other | Attending: Emergency Medicine | Admitting: Emergency Medicine

## 2017-04-13 DIAGNOSIS — F17210 Nicotine dependence, cigarettes, uncomplicated: Secondary | ICD-10-CM | POA: Insufficient documentation

## 2017-04-13 DIAGNOSIS — R1031 Right lower quadrant pain: Secondary | ICD-10-CM | POA: Diagnosis not present

## 2017-04-13 DIAGNOSIS — J45909 Unspecified asthma, uncomplicated: Secondary | ICD-10-CM | POA: Insufficient documentation

## 2017-04-13 DIAGNOSIS — Z7984 Long term (current) use of oral hypoglycemic drugs: Secondary | ICD-10-CM | POA: Insufficient documentation

## 2017-04-13 DIAGNOSIS — E039 Hypothyroidism, unspecified: Secondary | ICD-10-CM | POA: Diagnosis not present

## 2017-04-13 DIAGNOSIS — R109 Unspecified abdominal pain: Secondary | ICD-10-CM | POA: Diagnosis present

## 2017-04-13 DIAGNOSIS — E119 Type 2 diabetes mellitus without complications: Secondary | ICD-10-CM | POA: Insufficient documentation

## 2017-04-13 DIAGNOSIS — J449 Chronic obstructive pulmonary disease, unspecified: Secondary | ICD-10-CM | POA: Diagnosis not present

## 2017-04-13 LAB — CBC
HCT: 44.4 % (ref 35.0–47.0)
Hemoglobin: 15.5 g/dL (ref 12.0–16.0)
MCH: 33.1 pg (ref 26.0–34.0)
MCHC: 35 g/dL (ref 32.0–36.0)
MCV: 94.6 fL (ref 80.0–100.0)
Platelets: 269 10*3/uL (ref 150–440)
RBC: 4.7 MIL/uL (ref 3.80–5.20)
RDW: 13.7 % (ref 11.5–14.5)
WBC: 12 10*3/uL — ABNORMAL HIGH (ref 3.6–11.0)

## 2017-04-13 LAB — COMPREHENSIVE METABOLIC PANEL
ALBUMIN: 4.3 g/dL (ref 3.5–5.0)
ALK PHOS: 87 U/L (ref 38–126)
ALT: 22 U/L (ref 14–54)
AST: 19 U/L (ref 15–41)
Anion gap: 9 (ref 5–15)
BILIRUBIN TOTAL: 0.7 mg/dL (ref 0.3–1.2)
BUN: 11 mg/dL (ref 6–20)
CALCIUM: 9.2 mg/dL (ref 8.9–10.3)
CO2: 29 mmol/L (ref 22–32)
CREATININE: 0.7 mg/dL (ref 0.44–1.00)
Chloride: 102 mmol/L (ref 101–111)
GFR calc Af Amer: >60 mL/min (ref >60)
GFR calc non Af Amer: >60 mL/min (ref >60)
GLUCOSE: 174 mg/dL — AB (ref 65–99)
Potassium: 4.2 mmol/L (ref 3.5–5.1)
Sodium: 140 mmol/L (ref 135–145)
TOTAL PROTEIN: 7.6 g/dL (ref 6.5–8.1)

## 2017-04-13 LAB — URINALYSIS, COMPLETE (UACMP) WITH MICROSCOPIC
Bacteria, UA: NONE SEEN
Bilirubin Urine: NEGATIVE
GLUCOSE, UA: NEGATIVE mg/dL
Ketones, ur: NEGATIVE mg/dL
Leukocytes, UA: NEGATIVE
NITRITE: NEGATIVE
Protein, ur: NEGATIVE mg/dL
SPECIFIC GRAVITY, URINE: 1.014 (ref 1.005–1.030)
pH: 5 (ref 5.0–8.0)

## 2017-04-13 LAB — LIPASE, BLOOD: Lipase: 36 U/L (ref 11–51)

## 2017-04-13 MED ORDER — DICLOFENAC SODIUM 3 % TD GEL: 1.0000 "application " | Freq: Two times a day (BID) | TRANSDERMAL | 0 refills | Status: AC | PRN

## 2017-04-13 MED ORDER — IOPAMIDOL (ISOVUE-300) INJECTION 61%
100.0000 mL | Freq: Once | INTRAVENOUS | Status: AC | PRN
Start: 2017-04-13 — End: 2017-04-13
  Administered 2017-04-13: 100 mL via INTRAVENOUS

## 2017-04-13 MED ORDER — IOPAMIDOL (ISOVUE-300) INJECTION 61%
30.0000 mL | Freq: Once | INTRAVENOUS | Status: AC | PRN
  Administered 2017-04-13: 30 mL via ORAL

## 2017-04-13 MED ORDER — SODIUM CHLORIDE 0.9 % IV BOLUS (SEPSIS)
1000.0000 mL | Freq: Once | INTRAVENOUS | Status: AC
  Administered 2017-04-13: 1000 mL via INTRAVENOUS

## 2017-04-13 NOTE — ED Notes (Signed)
Pt c/o RLQ pain with nausea for the past couple of days, states she had diarrhea yesterday but has a hx of IBS.Marland Kitchen Denies any black or bloody stools. Pt is currently drinking oral contrast for CT and states she is ready to go home.

## 2017-04-13 NOTE — ED Provider Notes (Signed)
[facility] Provider Note  ____________________________________________   First MD Initiated Contact with Patient 04/13/17 1757     (approximate)  I have reviewed the triage vital signs and the nursing notes.   HISTORY  Chief Complaint Abdominal Pain and Flank Pain   HPI Stacey Everett is a 56 y.o. female with a history of diabetes as well as fibromyalgia who is present with 1-2 weeks of right flank pain that radiates to her right lower quadrant. She says that she has had nausea but no vomiting. Does not report any diarrhea. Says that she has had kidney stones in the past. Denies any gross blood in her urine. Says the pain is sharp and worse with movement and rates it as a 9 out of 10 at this time. She says that the pain was improving as of Saturday but then she was lifting something and or claws and the pain got suddenly worse. She does not report any radiation to her lower extremities. She has a history of urinary incontinence which is chronic and unchanged.   Past Medical History:  Diagnosis Date  . Asthma   . Diabetes mellitus without complication (HCC)   . Fibromyalgia   . GERD (gastroesophageal reflux disease)     Patient Active Problem List   Diagnosis Date Noted  . Tobacco abuse disorder 01/10/2016  . COPD (chronic obstructive pulmonary disease) (HCC) 11/21/2015  . Low back pain 11/21/2015  . Post traumatic stress disorder (PTSD) 11/08/2015  . Major depression 11/08/2015  . Arthralgia 10/25/2015  . Sciatic pain 10/25/2015  . Diabetes (HCC) 10/04/2015  . Hypothyroidism 10/04/2015  . High cholesterol 10/04/2015  . Arthritis 03/29/2014    History reviewed. No pertinent surgical history.  Prior to Admission medications   Medication Sig Start Date End Date Taking? Authorizing Provider  albuterol (PROVENTIL HFA;VENTOLIN HFA) 108 (90 Base) MCG/ACT inhaler Inhale 2 puffs into the lungs every 6 (six) hours as needed  for wheezing or shortness of breath (6 puffs every 6 hours as needed.). 08/12/16   Maryjo Rochester, MD  atorvastatin (LIPITOR) 20 MG tablet Take 2 tablets (40 mg total) by mouth daily. Reported on 01/10/2016 08/12/16   Maryjo Rochester, MD  busPIRone (BUSPAR) 10 MG tablet Take 10 mg by mouth 2 (two) times daily.    [provider]  cyclobenzaprine (FLEXERIL) 10 MG tablet Take 1 tablet (10 mg total) by mouth at bedtime as needed for muscle spasms. 1 by mouth at bedtime as needed for back pain. 08/12/16   Maryjo Rochester, MD  diclofenac sodium (VOLTAREN) 1 % GEL Apply 2 g topically 4 (four) times daily. Apply 2 grams to affected joint up to 4 times daily for pain. 08/12/16   Maryjo Rochester, MD  fluticasone (FLONASE) 50 MCG/ACT nasal spray Place 2 sprays into both nostrils daily. 1 spray in each nostril everyday. 08/12/16   Maryjo Rochester, MD  Fluticasone-Salmeterol (ADVAIR DISKUS) 250-50 MCG/DOSE AEPB Inhale 1 puff into the lungs 2 (two) times daily. 08/12/16   Maryjo Rochester, MD  gabapentin (NEURONTIN) 300 MG capsule Take 3 capsules (900 mg total) by mouth 3 (three) times daily. Take two tablets three times a day 08/12/16   Maryjo Rochester, MD  HYDROcodone-acetaminophen (NORCO/VICODIN) 5-325 MG tablet Take 1 tablet by mouth every 6 (six) hours as needed for moderate pain. Patient not taking: Reported on 08/12/2016 03/18/16   Tommi Rumps, PA-C  ipratropium-albuterol (DUONEB) 0.5-2.5 (3) MG/3ML SOLN  Take 0.5-2.5 mLs by nebulization every 6 (six) hours as needed. 08/12/16   Maryjo Rochester, MD  levothyroxine (SYNTHROID, LEVOTHROID) 100 MCG tablet Take 1 tablet (100 mcg total) by mouth daily before breakfast for hypothyroidism.- need rx 08/12/16   Maryjo Rochester, MD  metFORMIN (GLUCOPHAGE) 500 MG tablet Take 2 tablets (1,000 mg total) by mouth daily with supper. 08/12/16   Maryjo Rochester, MD  mometasone (ASMANEX 60 METERED DOSES) 220 MCG/INH inhaler Inhale 2 puffs into the lungs 2 (two) times daily. 08/12/16   Maryjo Rochester, MD  Omega-3 Fatty Acids (FISH OIL) 1000 MG CAPS Take 1 capsule by mouth daily.    [provider]  TURMERIC PO Take 1 tablet by mouth daily.    [provider]  venlafaxine XR (EFFEXOR-XR) 75 MG 24 hr capsule Take 75 mg by mouth daily with breakfast.    [provider]  vitamin B-12 (CYANOCOBALAMIN) 1000 MCG tablet Take 1 tablet (1,000 mcg total) by mouth daily. 08/12/16   Maryjo Rochester, MD  Vitamin D, Cholecalciferol, 1000 units TABS Take 1 tablet by mouth daily. Reported on 02/07/2016    [provider]    Allergies Nitrofurantoin; Azithromycin; Celebrex [celecoxib]; Paroxetine; Paxil [paroxetine hcl]; Salmeterol; and Sulfur  Family History  Problem Relation Age of Onset  . Lung cancer Father   . Diabetes Father   . Atrial fibrillation Father   . Heart attack Father   . Stroke Father   . Cancer Father   . Colon cancer Paternal Grandmother   . Cancer Sister     Social History Social History  Substance Use Topics  . Smoking status: Current Every Day Smoker    Packs/day: 0.50    Years: 14.00    Types: Cigarettes  . Smokeless tobacco: Never Used  . Alcohol use No    Review of Systems  Constitutional: No fever/chills Eyes: No visual changes. ENT: No sore throat. Cardiovascular: Denies chest pain. Respiratory: Denies shortness of breath. Gastrointestinal: no vomiting.  No diarrhea.  No constipation. Genitourinary: Negative for dysuria. Musculoskeletal: as above. Skin: Negative for rash. Neurological: Negative for headaches, focal weakness or numbness.   ____________________________________________   PHYSICAL EXAM:  VITAL SIGNS: ED Triage Vitals  Enc Vitals Group     BP 04/13/17 1555 138/86     Pulse Rate 04/13/17 1555 81     Resp 04/13/17 1555 18     Temp 04/13/17 1555 98.2 F (36.8 C)     Temp Source 04/13/17 1555 Oral     SpO2 04/13/17 1555 96 %     Weight 04/13/17 1555 197 lb (89.4 kg)     Height 04/13/17 1555 5\' 3"   (1.6 m)     Head Circumference --      Peak Flow --      Pain Score 04/13/17 1556 9     Pain Loc --      Pain Edu? --      Excl. in GC? --     Constitutional: Alert and oriented. Well appearing and in no acute distress. Eyes: Conjunctivae are normal.  Head: Atraumatic. Nose: No congestion/rhinnorhea. Mouth/Throat: Mucous membranes are moist.  Neck: No stridor.   Cardiovascular: Normal rate, regular rhythm. Grossly normal heart sounds.   Respiratory: Normal respiratory effort.  No retractions. Lungs CTAB. Gastrointestinal: Soft with mild right lower quadrant tenderness to palpation.. No distention. Mild right CVA tenderness to palpation. Musculoskeletal: No lower extremity tenderness nor edema.  No joint effusions. Neurologic:  Normal speech and language. No gross focal neurologic deficits are appreciated. Patient able to ambulate without issue throughout the room with a normal gait and no assistance. Skin:  Skin is warm, dry and intact. No rash noted. Psychiatric: Mood and affect are normal. Speech and behavior are normal.  ____________________________________________   LABS (all labs ordered are listed, but only abnormal results are displayed)  Labs Reviewed  COMPREHENSIVE METABOLIC PANEL - Abnormal; Notable for the following:       Result Value   Glucose, Bld 174 (*)    All other components within normal limits  CBC - Abnormal; Notable for the following:    WBC 12.0 (*)    All other components within normal limits  URINALYSIS, COMPLETE (UACMP) WITH MICROSCOPIC - Abnormal; Notable for the following:    Color, Urine YELLOW (*)    APPearance CLEAR (*)    Hgb urine dipstick SMALL (*)    Squamous Epithelial / LPF 0-5 (*)    All other components within normal limits  LIPASE, BLOOD   ____________________________________________  EKG    ____________________________________________  RADIOLOGY  No acute finding on the CT the abdomen and pelvis. There was nodularity noted  to the adrenals bilaterally. Recommends twelve-month follow-up. ____________________________________________   PROCEDURES  Procedure(s) performed:   Procedures  Critical Care performed:   ____________________________________________   INITIAL IMPRESSION / ASSESSMENT AND PLAN / ED COURSE  Pertinent labs & imaging results that were available during my care of the patient were reviewed by me and considered in my medical decision making (see chart for details).  ----------------------------------------- 9:19 PM on 04/13/2017 -----------------------------------------  Patient states that she has urinated and had 2 bowel movements and is feeling much improved. I read examined her abdomen and there is no tenderness although she still says that she is having some degree of mild to moderate pain. Patient reported now that she also was not supposed to lift a protective 25 pound child recently. Suspecting musculoskeletal etiology. Patient will be discharged with diclofenac gel. Patient is understanding of the imaging results as well as the need for follow-up and willing to comply. Will be discharged home.      ____________________________________________   FINAL CLINICAL IMPRESSION(S) / ED DIAGNOSES  Right lower quadrant abdominal pain and flank pain.    NEW MEDICATIONS STARTED DURING THIS VISIT:  New Prescriptions   No medications on file     Note:  This document was prepared using Dragon voice recognition software and may include unintentional dictation errors.     Myrna Blazer, MD 04/13/17 2120

## 2017-04-13 NOTE — ED Triage Notes (Signed)
Pt reports for 2 weeks she has been having RLQ abd pain with flank pain. Denies dysuria. Denies NVD.

## 2017-04-13 NOTE — ED Notes (Signed)
Pt up to toilet 

## 2017-04-13 NOTE — ED Notes (Signed)
CT Notified that pt has finished drinking the oral contrast.

## 2017-04-13 NOTE — ED Notes (Signed)
Patient transported to CT 

## 2017-06-11 ENCOUNTER — Ambulatory Visit: Payer: Self-pay | Admitting: Ophthalmology

## 2017-11-04 ENCOUNTER — Other Ambulatory Visit: Payer: Self-pay | Admitting: Internal Medicine

## 2017-11-04 DIAGNOSIS — Z1231 Encounter for screening mammogram for malignant neoplasm of breast: Secondary | ICD-10-CM

## 2020-01-13 ENCOUNTER — Other Ambulatory Visit: Payer: Self-pay | Admitting: Internal Medicine

## 2020-01-13 DIAGNOSIS — Z1231 Encounter for screening mammogram for malignant neoplasm of breast: Secondary | ICD-10-CM

## 2020-01-19 ENCOUNTER — Ambulatory Visit
Admission: RE | Admit: 2020-01-19 | Discharge: 2020-01-19 | Disposition: A | Discharge status: Home / Self Care | Payer: Medicare Other | Source: Ambulatory Visit | Attending: Internal Medicine | Admitting: Internal Medicine

## 2020-01-19 DIAGNOSIS — Z1231 Encounter for screening mammogram for malignant neoplasm of breast: Secondary | ICD-10-CM | POA: Diagnosis not present

## 2020-10-17 ENCOUNTER — Emergency Department
Admission: EM | Admit: 2020-10-17 | Discharge: 2020-10-17 | Disposition: A | Discharge status: Home / Self Care | Payer: Medicare Other | Attending: Emergency Medicine | Admitting: Emergency Medicine

## 2020-10-17 ENCOUNTER — Other Ambulatory Visit: Payer: Self-pay

## 2020-10-17 ENCOUNTER — Emergency Department: Payer: Medicare Other

## 2020-10-17 DIAGNOSIS — M19011 Primary osteoarthritis, right shoulder: Secondary | ICD-10-CM

## 2020-10-17 DIAGNOSIS — M19012 Primary osteoarthritis, left shoulder: Secondary | ICD-10-CM | POA: Insufficient documentation

## 2020-10-17 DIAGNOSIS — E039 Hypothyroidism, unspecified: Secondary | ICD-10-CM | POA: Insufficient documentation

## 2020-10-17 DIAGNOSIS — F17210 Nicotine dependence, cigarettes, uncomplicated: Secondary | ICD-10-CM | POA: Insufficient documentation

## 2020-10-17 DIAGNOSIS — Y92410 Unspecified street and highway as the place of occurrence of the external cause: Secondary | ICD-10-CM | POA: Insufficient documentation

## 2020-10-17 DIAGNOSIS — Z79899 Other long term (current) drug therapy: Secondary | ICD-10-CM | POA: Diagnosis not present

## 2020-10-17 DIAGNOSIS — J45909 Unspecified asthma, uncomplicated: Secondary | ICD-10-CM | POA: Insufficient documentation

## 2020-10-17 DIAGNOSIS — E119 Type 2 diabetes mellitus without complications: Secondary | ICD-10-CM | POA: Insufficient documentation

## 2020-10-17 DIAGNOSIS — Z7984 Long term (current) use of oral hypoglycemic drugs: Secondary | ICD-10-CM | POA: Diagnosis not present

## 2020-10-17 DIAGNOSIS — Z7952 Long term (current) use of systemic steroids: Secondary | ICD-10-CM | POA: Insufficient documentation

## 2020-10-17 DIAGNOSIS — J449 Chronic obstructive pulmonary disease, unspecified: Secondary | ICD-10-CM | POA: Insufficient documentation

## 2020-10-17 DIAGNOSIS — M25512 Pain in left shoulder: Secondary | ICD-10-CM | POA: Diagnosis present

## 2020-10-17 DIAGNOSIS — M542 Cervicalgia: Secondary | ICD-10-CM | POA: Diagnosis not present

## 2020-10-17 MED ORDER — LIDOCAINE 5 % EX PTCH: 1.0000 | MEDICATED_PATCH | Freq: Two times a day (BID) | CUTANEOUS | 0 refills | Status: AC

## 2020-10-17 MED ORDER — LIDOCAINE 5 % EX PTCH
1.0000 | MEDICATED_PATCH | CUTANEOUS | Status: DC
  Administered 2020-10-17: 1 via TRANSDERMAL
  Filled 2020-10-17: qty 1

## 2020-10-17 NOTE — Discharge Instructions (Signed)
No acute findings on x-ray of the left shoulder.  Follow discharge care instructions.  Advise over-the-counter anti-inflammatories and use Lidoderm patch as directed.
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
## 2020-11-06 ENCOUNTER — Other Ambulatory Visit: Payer: Self-pay | Admitting: Physician Assistant

## 2020-11-06 DIAGNOSIS — R2231 Localized swelling, mass and lump, right upper limb: Secondary | ICD-10-CM
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
## 2021-01-30 DIAGNOSIS — E119 Type 2 diabetes mellitus without complications: Secondary | ICD-10-CM

## 2021-01-30 LAB — GLUCOSE, POCT (MANUAL RESULT ENTRY): POC Glucose: 349 mg/dl — AB (ref 70–99)

## 2021-01-30 NOTE — Congregational Nurse Program (Signed)
  Dept: [phone]   Congregational Nurse Program Note  Date of Encounter: 01/30/2021  Past Medical History: Past Medical History:  Diagnosis Date  . Asthma   . Diabetes mellitus without complication (HCC)   . Fibromyalgia   . GERD (gastroesophageal reflux disease)     Encounter Details:  CNP Questionnaire - 01/30/21 1440      Questionnaire   Do you give verbal consent to treat you today? Yes    Visit Setting [employer] or Engineer, technical sales Patient Served At [employer], [employer]    Patient Status Not Applicable    Medical Provider Yes    Insurance Medicare;Private Insurance    Intervention Assess (including screenings);Refer;Educate    Food Have food insecurities    Referrals PCP - other provider    Screening Referrals Diabetic (Hgb A1C, Vision, Podiatry)         [facility]. Was seen by Dr. Dava Najjar for eye exam including AOA diabetic eye exam 01/24/21. Needs assistance obtaining glasses "really badly; I can't see!". Assisted with access to glasses by ordering through [facility] optics on line to be paid by benevolent fund. During visit BP screened 138/78. Glucose was 349...nonfasting but hasn't eaten lunch (taken @  1:40 pm). Patient states she has been taking all prescribed meds; isn't on insulin. Admits to more anxiety today that meds don't seem to be impacting as usual but no other symptoms. Has an open crusty, cracked, crusty right middle finger with pain from tip of finger to first joint. Counseled patient to immediately seek MD care. Counseled re: S&S to be watching for. TC to Dr. Judeth Porch clinic. Spoke with nurse re: screening results. Clinic with follow up with pt. Pt also co to call Dr.'s office ASAP. F/U as desired. RHemann, RN

## 2021-02-07 DIAGNOSIS — E119 Type 2 diabetes mellitus without complications: Secondary | ICD-10-CM

## 2021-03-06 LAB — GLUCOSE, POCT (MANUAL RESULT ENTRY): POC Glucose: 205 mg/dl — AB (ref 70–99)

## 2021-03-06 NOTE — Congregational Nurse Program (Signed)
  Dept: [phone]   Congregational Nurse Program Note  Date of Encounter: 02/07/2021  Past Medical History: Past Medical History:  Diagnosis Date   Asthma    Diabetes mellitus without complication (HCC)    Fibromyalgia    GERD (gastroesophageal reflux disease)     Encounter Details:  CNP Questionnaire - 03/06/21 1400       Questionnaire   Do you give verbal consent to treat you today? 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 Sq. A nurse-friend gave first dose but is sick and cannot assist. Client brought supplies, reviewed instructions, nurse observed client self administer correctly subq into abdomen. Glucose 205 non-fasting. Co. Re need for diet changes even though now started on weekly meds for elevated glucose. Pt verbalizes understanding to watch serving sizes even of healthy foods like fruits and to add healthy protein to all meals/snacks. Will talk to MD about need for glucometer.  Follow up to previous visit. Client has glasses purchased through the [facility]. "Love my new glasses!"  Self-treating nail on Right middle finger with OTC fungus treatment. Less red than previous visit. Encouraged to follow up with PCP for appropriate diagnosis and treatment. Follow up in this clinic in 1-2 wks for Diabetic education and screening. Rhermann, RN

## 2021-04-30 DIAGNOSIS — F3341 Major depressive disorder, recurrent, in partial remission: Secondary | ICD-10-CM | POA: Diagnosis not present

## 2021-04-30 DIAGNOSIS — F4310 Post-traumatic stress disorder, unspecified: Secondary | ICD-10-CM | POA: Diagnosis not present

## 2021-04-30 DIAGNOSIS — F411 Generalized anxiety disorder: Secondary | ICD-10-CM | POA: Diagnosis not present

## 2021-04-30 DIAGNOSIS — Z6835 Body mass index (BMI) 35.0-35.9, adult: Secondary | ICD-10-CM | POA: Diagnosis not present

## 2021-04-30 DIAGNOSIS — E039 Hypothyroidism, unspecified: Secondary | ICD-10-CM | POA: Diagnosis not present

## 2021-04-30 DIAGNOSIS — R8290 Unspecified abnormal findings in urine: Secondary | ICD-10-CM | POA: Diagnosis not present

## 2021-04-30 DIAGNOSIS — E1165 Type 2 diabetes mellitus with hyperglycemia: Secondary | ICD-10-CM | POA: Diagnosis not present

## 2021-04-30 DIAGNOSIS — L03011 Cellulitis of right finger: Secondary | ICD-10-CM | POA: Diagnosis not present

## 2021-04-30 DIAGNOSIS — R32 Unspecified urinary incontinence: Secondary | ICD-10-CM | POA: Diagnosis not present

## 2021-05-06 DIAGNOSIS — L298 Other pruritus: Secondary | ICD-10-CM | POA: Diagnosis not present

## 2021-05-06 DIAGNOSIS — F419 Anxiety disorder, unspecified: Secondary | ICD-10-CM | POA: Diagnosis not present

## 2021-05-06 DIAGNOSIS — F32A Depression, unspecified: Secondary | ICD-10-CM | POA: Diagnosis not present

## 2021-05-06 DIAGNOSIS — Z23 Encounter for immunization: Secondary | ICD-10-CM | POA: Diagnosis not present

## 2021-05-06 DIAGNOSIS — E1165 Type 2 diabetes mellitus with hyperglycemia: Secondary | ICD-10-CM | POA: Diagnosis not present

## 2021-05-06 DIAGNOSIS — F4310 Post-traumatic stress disorder, unspecified: Secondary | ICD-10-CM | POA: Diagnosis not present

## 2021-05-06 DIAGNOSIS — Z0000 Encounter for general adult medical examination without abnormal findings: Secondary | ICD-10-CM | POA: Diagnosis not present

## 2021-05-06 DIAGNOSIS — L03011 Cellulitis of right finger: Secondary | ICD-10-CM | POA: Diagnosis not present

## 2021-05-06 DIAGNOSIS — E039 Hypothyroidism, unspecified: Secondary | ICD-10-CM | POA: Diagnosis not present

## 2021-05-07 ENCOUNTER — Other Ambulatory Visit: Payer: Self-pay | Admitting: Internal Medicine

## 2021-05-07 DIAGNOSIS — Z1231 Encounter for screening mammogram for malignant neoplasm of breast: Secondary | ICD-10-CM

## 2021-06-25 DIAGNOSIS — E119 Type 2 diabetes mellitus without complications: Secondary | ICD-10-CM | POA: Diagnosis not present

## 2021-06-25 DIAGNOSIS — J441 Chronic obstructive pulmonary disease with (acute) exacerbation: Secondary | ICD-10-CM | POA: Diagnosis not present

## 2021-07-09 DIAGNOSIS — E119 Type 2 diabetes mellitus without complications: Secondary | ICD-10-CM | POA: Diagnosis not present

## 2021-07-09 DIAGNOSIS — Z0100 Encounter for examination of eyes and vision without abnormal findings: Secondary | ICD-10-CM | POA: Diagnosis not present

## 2021-07-09 DIAGNOSIS — H04123 Dry eye syndrome of bilateral lacrimal glands: Secondary | ICD-10-CM | POA: Diagnosis not present

## 2021-07-09 DIAGNOSIS — H2513 Age-related nuclear cataract, bilateral: Secondary | ICD-10-CM | POA: Diagnosis not present
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
## 2021-12-18 ENCOUNTER — Other Ambulatory Visit: Payer: Self-pay | Admitting: Internal Medicine

## 2021-12-18 DIAGNOSIS — Z1231 Encounter for screening mammogram for malignant neoplasm of breast: Secondary | ICD-10-CM

## 2022-01-15 NOTE — Plan of Care (Unsigned)
Patient inquired about obtaining assistance with Ozempic while in coverage gap.  Patient sees a [facility] provider-Dr. Marcello Fennel.  She is not seen by a [facility] provider.  Suggested that patient work with her provider to complete PAP application.  Patient agreed.  Sherilyn Dacosta Patient Advocate Specialist [facility] at [facility]

## 2022-03-28 ENCOUNTER — Ambulatory Visit
Admission: RE | Admit: 2022-03-28 | Discharge: 2022-03-28 | Disposition: A | Discharge status: Home / Self Care | Payer: Medicare (Managed Care) | Source: Ambulatory Visit | Attending: Dermatology | Admitting: Dermatology

## 2022-03-28 ENCOUNTER — Other Ambulatory Visit: Payer: Self-pay | Admitting: Dermatology

## 2022-03-28 ENCOUNTER — Other Ambulatory Visit
Admission: RE | Admit: 2022-03-28 | Discharge: 2022-03-28 | Disposition: A | Discharge status: Home / Self Care | Payer: Medicare (Managed Care) | Source: Ambulatory Visit | Attending: Dermatology | Admitting: Dermatology

## 2022-03-28 DIAGNOSIS — L609 Nail disorder, unspecified: Secondary | ICD-10-CM | POA: Insufficient documentation

## 2022-11-24 DIAGNOSIS — J4540 Moderate persistent asthma, uncomplicated: Secondary | ICD-10-CM | POA: Diagnosis not present

## 2022-11-24 DIAGNOSIS — F411 Generalized anxiety disorder: Secondary | ICD-10-CM | POA: Diagnosis not present

## 2022-11-24 DIAGNOSIS — F3340 Major depressive disorder, recurrent, in remission, unspecified: Secondary | ICD-10-CM | POA: Diagnosis not present

## 2022-11-24 DIAGNOSIS — Z720 Tobacco use: Secondary | ICD-10-CM | POA: Diagnosis not present

## 2022-11-24 DIAGNOSIS — E1165 Type 2 diabetes mellitus with hyperglycemia: Secondary | ICD-10-CM | POA: Diagnosis not present

## 2022-11-24 DIAGNOSIS — Z23 Encounter for immunization: Secondary | ICD-10-CM | POA: Diagnosis not present

## 2022-11-24 DIAGNOSIS — C44622 Squamous cell carcinoma of skin of right upper limb, including shoulder: Secondary | ICD-10-CM | POA: Diagnosis not present

## 2022-11-24 DIAGNOSIS — I10 Essential (primary) hypertension: Secondary | ICD-10-CM | POA: Diagnosis not present

## 2022-11-27 DIAGNOSIS — I10 Essential (primary) hypertension: Secondary | ICD-10-CM | POA: Diagnosis not present

## 2022-11-27 DIAGNOSIS — F411 Generalized anxiety disorder: Secondary | ICD-10-CM | POA: Diagnosis not present

## 2022-11-27 DIAGNOSIS — Z0000 Encounter for general adult medical examination without abnormal findings: Secondary | ICD-10-CM | POA: Diagnosis not present

## 2022-11-27 DIAGNOSIS — J4540 Moderate persistent asthma, uncomplicated: Secondary | ICD-10-CM | POA: Diagnosis not present

## 2022-11-27 DIAGNOSIS — E119 Type 2 diabetes mellitus without complications: Secondary | ICD-10-CM | POA: Diagnosis not present

## 2022-11-27 DIAGNOSIS — F3341 Major depressive disorder, recurrent, in partial remission: Secondary | ICD-10-CM | POA: Diagnosis not present

## 2022-11-27 DIAGNOSIS — F17218 Nicotine dependence, cigarettes, with other nicotine-induced disorders: Secondary | ICD-10-CM | POA: Diagnosis not present

## 2022-11-27 DIAGNOSIS — Z6831 Body mass index (BMI) 31.0-31.9, adult: Secondary | ICD-10-CM | POA: Diagnosis not present

## 2022-11-27 DIAGNOSIS — M1712 Unilateral primary osteoarthritis, left knee: Secondary | ICD-10-CM | POA: Diagnosis not present

## 2022-11-27 DIAGNOSIS — M129 Arthropathy, unspecified: Secondary | ICD-10-CM | POA: Diagnosis not present

## 2022-11-27 DIAGNOSIS — L408 Other psoriasis: Secondary | ICD-10-CM | POA: Diagnosis not present

## 2022-11-27 DIAGNOSIS — J439 Emphysema, unspecified: Secondary | ICD-10-CM | POA: Diagnosis not present

## 2022-12-05 ENCOUNTER — Other Ambulatory Visit: Payer: Self-pay

## 2022-12-25 DIAGNOSIS — H9201 Otalgia, right ear: Secondary | ICD-10-CM | POA: Diagnosis not present

## 2022-12-25 DIAGNOSIS — J029 Acute pharyngitis, unspecified: Secondary | ICD-10-CM | POA: Diagnosis not present

## 2022-12-25 DIAGNOSIS — R052 Subacute cough: Secondary | ICD-10-CM | POA: Diagnosis not present

## 2023-02-02 DIAGNOSIS — R051 Acute cough: Secondary | ICD-10-CM | POA: Diagnosis not present

## 2023-02-02 DIAGNOSIS — Z03818 Encounter for observation for suspected exposure to other biological agents ruled out: Secondary | ICD-10-CM | POA: Diagnosis not present

## 2023-02-02 DIAGNOSIS — R509 Fever, unspecified: Secondary | ICD-10-CM | POA: Diagnosis not present

## 2023-02-09 DIAGNOSIS — E1165 Type 2 diabetes mellitus with hyperglycemia: Secondary | ICD-10-CM | POA: Diagnosis not present

## 2023-02-09 DIAGNOSIS — I10 Essential (primary) hypertension: Secondary | ICD-10-CM | POA: Diagnosis not present

## 2023-02-09 DIAGNOSIS — E039 Hypothyroidism, unspecified: Secondary | ICD-10-CM | POA: Diagnosis not present

## 2023-02-09 DIAGNOSIS — F411 Generalized anxiety disorder: Secondary | ICD-10-CM | POA: Diagnosis not present

## 2023-02-09 DIAGNOSIS — F3341 Major depressive disorder, recurrent, in partial remission: Secondary | ICD-10-CM | POA: Diagnosis not present

## 2023-02-25 DIAGNOSIS — F3341 Major depressive disorder, recurrent, in partial remission: Secondary | ICD-10-CM | POA: Diagnosis not present

## 2023-02-25 DIAGNOSIS — E1165 Type 2 diabetes mellitus with hyperglycemia: Secondary | ICD-10-CM | POA: Diagnosis not present

## 2023-02-25 DIAGNOSIS — E039 Hypothyroidism, unspecified: Secondary | ICD-10-CM | POA: Diagnosis not present

## 2023-02-25 DIAGNOSIS — I10 Essential (primary) hypertension: Secondary | ICD-10-CM | POA: Diagnosis not present

## 2023-02-25 DIAGNOSIS — F411 Generalized anxiety disorder: Secondary | ICD-10-CM | POA: Diagnosis not present

## 2024-05-19 ENCOUNTER — Emergency Department

## 2024-05-19 ENCOUNTER — Emergency Department
Admission: EM | Admit: 2024-05-19 | Discharge: 2024-05-19 | Disposition: A | Discharge status: Home / Self Care | Attending: Emergency Medicine | Admitting: Emergency Medicine

## 2024-05-19 ENCOUNTER — Encounter: Payer: Self-pay | Admitting: Emergency Medicine

## 2024-05-19 ENCOUNTER — Other Ambulatory Visit: Payer: Self-pay

## 2024-05-19 DIAGNOSIS — E119 Type 2 diabetes mellitus without complications: Secondary | ICD-10-CM | POA: Insufficient documentation

## 2024-05-19 DIAGNOSIS — E039 Hypothyroidism, unspecified: Secondary | ICD-10-CM | POA: Insufficient documentation

## 2024-05-19 DIAGNOSIS — F17210 Nicotine dependence, cigarettes, uncomplicated: Secondary | ICD-10-CM | POA: Diagnosis not present

## 2024-05-19 DIAGNOSIS — J441 Chronic obstructive pulmonary disease with (acute) exacerbation: Secondary | ICD-10-CM | POA: Diagnosis not present

## 2024-05-19 DIAGNOSIS — R0602 Shortness of breath: Secondary | ICD-10-CM | POA: Diagnosis present

## 2024-05-19 LAB — BASIC METABOLIC PANEL WITH GFR
Anion gap: 14 (ref 5–15)
BUN: 11 mg/dL (ref 8–23)
CO2: 27 mmol/L (ref 22–32)
Calcium: 9.1 mg/dL (ref 8.9–10.3)
Chloride: 98 mmol/L (ref 98–111)
Creatinine, Ser: 0.68 mg/dL (ref 0.44–1.00)
GFR, Estimated: >60 mL/min (ref >60)
Glucose, Bld: 165 mg/dL — ABNORMAL HIGH (ref 70–99)
Potassium: 4.1 mmol/L (ref 3.5–5.1)
Sodium: 139 mmol/L (ref 135–145)

## 2024-05-19 LAB — CBC
HCT: 44.7 % (ref 36.0–46.0)
Hemoglobin: 15.2 g/dL — ABNORMAL HIGH (ref 12.0–15.0)
MCH: 32.8 pg (ref 26.0–34.0)
MCHC: 34 g/dL (ref 30.0–36.0)
MCV: 96.5 fL (ref 80.0–100.0)
Platelets: 324 K/uL (ref 150–400)
RBC: 4.63 MIL/uL (ref 3.87–5.11)
RDW: 13.3 % (ref 11.5–15.5)
WBC: 6.7 K/uL (ref 4.0–10.5)
nRBC: 0 % (ref 0.0–0.2)

## 2024-05-19 LAB — TROPONIN I (HIGH SENSITIVITY): Troponin I (High Sensitivity): <2 ng/L (ref <18)

## 2024-05-19 MED ORDER — METHYLPREDNISOLONE SODIUM SUCC 125 MG IJ SOLR
125.0000 mg | Freq: Once | INTRAMUSCULAR | Status: AC
  Administered 2024-05-19: 125 mg via INTRAVENOUS
  Filled 2024-05-19: qty 2

## 2024-05-19 MED ORDER — ALBUTEROL SULFATE (2.5 MG/3ML) 0.083% IN NEBU
2.5000 mg | INHALATION_SOLUTION | Freq: Once | RESPIRATORY_TRACT | Status: AC
  Administered 2024-05-19: 2.5 mg via RESPIRATORY_TRACT
  Filled 2024-05-19: qty 3

## 2024-05-19 MED ORDER — IPRATROPIUM-ALBUTEROL 0.5-2.5 (3) MG/3ML IN SOLN
3.0000 mL | Freq: Once | RESPIRATORY_TRACT | Status: AC
  Administered 2024-05-19: 3 mL via RESPIRATORY_TRACT
  Filled 2024-05-19: qty 3

## 2024-05-19 MED ORDER — ALBUTEROL SULFATE HFA 108 (90 BASE) MCG/ACT IN AERS: 2.0000 | INHALATION_SPRAY | Freq: Four times a day (QID) | RESPIRATORY_TRACT | 2 refills | Status: AC | PRN

## 2024-05-19 MED ORDER — ONDANSETRON HCL 4 MG/2ML IJ SOLN
4.0000 mg | Freq: Once | INTRAMUSCULAR | Status: AC
  Administered 2024-05-19: 4 mg via INTRAVENOUS
  Filled 2024-05-19: qty 2

## 2024-05-19 MED ORDER — PREDNISONE 10 MG PO TABS: 50.0000 mg | ORAL_TABLET | Freq: Every day | ORAL | 0 refills | Status: AC

## 2024-05-19 NOTE — ED Provider Notes (Signed)
 [facility] Provider Note    Event Date/Time   First MD Initiated Contact with Patient 05/19/24 1131     (approximate)   History   Shortness of Breath   HPI  Stacey Everett is a 63 y.o. female cigarette smoker with history of diabetes, hypothyroidism, COPD and as listed in EMR presents to the emergency department for treatment and evaluation of shortness of breath for the past 3 days and worsening wheezing not responding to her albuterol .  No known fever.     Physical Exam    Vitals:   05/19/24 1040 05/19/24 1516  BP: (!) 161/64 (!) 142/72  Pulse: 98 87  Resp: 20 18  Temp: 98.2 F (36.8 C)   SpO2: 95% 96%    General: Awake, no distress.  CV:  Good peripheral perfusion.  Resp:  Normal effort.  Diffuse wheezing throughout on auscultation. Abd:  No distention.  Other:     ED Results / Procedures / Treatments   Labs (all labs ordered are listed, but only abnormal results are displayed)  Labs Reviewed  BASIC METABOLIC PANEL WITH GFR - Abnormal; Notable for the following components:      Result Value   Glucose, Bld 165 (*)    All other components within normal limits  CBC - Abnormal; Notable for the following components:   Hemoglobin 15.2 (*)    All other components within normal limits  TROPONIN I (HIGH SENSITIVITY)     EKG  Normal sinus rhythm with a rate of 100   RADIOLOGY  Image and radiology report reviewed and interpreted by me. Radiology report consistent with the same.  Chest x-ray without acute cardiopulmonary abnormality.  PROCEDURES:  Critical Care performed: No  Procedures   MEDICATIONS ORDERED IN ED:  Medications  methylPREDNISolone  sodium succinate (SOLU-MEDROL ) 125 mg/2 mL injection 125 mg (125 mg Intravenous Given 05/19/24 1224)  ipratropium-albuterol  (DUONEB) 0.5-2.5 (3) MG/3ML nebulizer solution 3 mL (3 mLs Nebulization Given 05/19/24 1227)  ondansetron  (ZOFRAN ) injection 4 mg (4 mg Intravenous Given  05/19/24 1233)  albuterol  (PROVENTIL ) (2.5 MG/3ML) 0.083% nebulizer solution 2.5 mg (2.5 mg Nebulization Given 05/19/24 1452)     IMPRESSION / MDM / ASSESSMENT AND PLAN / ED COURSE   I have reviewed the triage note and vital signs. Vital signs stable   Differential diagnosis includes, but is not limited to, COPD exacerbation, pneumonia, COVID, influenza  Patient's presentation is most consistent with acute illness / injury with system symptoms.  Patient on cardiac monitor to evaluate for significant rate or rhythm changes.  63 year old female presenting to the emergency department for treatment and evaluation of 5 days of cough and increasing shortness of breath with history of COPD.  No relief with albuterol  inhaler.  Clinical Course as of 05/19/24 1814  Thu May 19, 2024  1231 Patient reporting nausea and is vomiting mucus.  DuoNeb treatment is in progress and Solu-Medrol  given.  Zofran  ordered and troponin added to labs already in process. [CT]    Clinical Course User Index [CT] Cornell Bourbon B, FNP   Some improvement after the DuoNeb.  No further vomiting after Zofran .  Patient reports that she is feeling better however she is still wheezing.  Albuterol  treatment ordered.  Patient anxious for discharge.  After albuterol , patient feels that she is ready to be discharged home.  Prescription for burst dose of prednisone  submitted to her pharmacy and refills of the albuterol  inhaler submitted.  No indication for antibiotic therapy at this  time.  Outpatient follow-up and ER return precautions were discussed.  FINAL CLINICAL IMPRESSION(S) / ED DIAGNOSES   Final diagnoses:  COPD exacerbation (HCC)     Rx / DC Orders   ED Discharge Orders          Ordered    predniSONE  (DELTASONE ) 10 MG tablet  Daily        05/19/24 1454    albuterol  (VENTOLIN  HFA) 108 (90 Base) MCG/ACT inhaler  Every 6 hours PRN        05/19/24 1454             Note:  This document was prepared using  Dragon voice recognition software and may include unintentional dictation errors.   Stacey Kirk NOVAK, FNP 05/19/24 1814    Claudene Rover, MD 05/20/24 [phone]

## 2024-05-19 NOTE — ED Triage Notes (Signed)
 Patient to ED via POV for SOB x3 days. Hx of COPD and asthma- taking breathing tx at home with no relief. Wheezing noted in triage. C/o pain in right ear. Speaking in full sentences without distress.

## 2024-05-19 NOTE — Discharge Instructions (Signed)
 Use your inhaler as prescribed.  Take the prednisone  as prescribed until finished.  Follow-up with your primary care provider for maintenance of COPD symptoms.  Return to the emergency department for symptoms change or worsen if you are unable to schedule an appointment with your primary care provider.
